# Patient Record
Sex: Male | Born: 1935 | Race: White | Hispanic: No | Smoking: Former smoker
Health system: Southern US, Community
[De-identification: ages and names within clinical notes are randomized; demographics above are authoritative.]

## PROBLEM LIST (undated history)

## (undated) DIAGNOSIS — I2510 Atherosclerotic heart disease of native coronary artery without angina pectoris: Secondary | ICD-10-CM

## (undated) DIAGNOSIS — E785 Hyperlipidemia, unspecified: Secondary | ICD-10-CM

## (undated) DIAGNOSIS — I10 Essential (primary) hypertension: Secondary | ICD-10-CM

## (undated) DIAGNOSIS — I509 Heart failure, unspecified: Secondary | ICD-10-CM

## (undated) DIAGNOSIS — N400 Enlarged prostate without lower urinary tract symptoms: Secondary | ICD-10-CM

## (undated) DIAGNOSIS — N184 Chronic kidney disease, stage 4 (severe): Secondary | ICD-10-CM

## (undated) HISTORY — DX: Heart failure, unspecified: I50.9

## (undated) HISTORY — PX: APPENDECTOMY: SHX54

## (undated) HISTORY — DX: Benign prostatic hyperplasia without lower urinary tract symptoms: N40.0

---

## 1984-09-05 HISTORY — PX: CORONARY ANGIOPLASTY: SHX604

## 2004-09-13 ENCOUNTER — Ambulatory Visit: Payer: Self-pay | Admitting: Internal Medicine

## 2005-09-23 ENCOUNTER — Ambulatory Visit: Payer: Self-pay | Admitting: Internal Medicine

## 2006-10-03 ENCOUNTER — Ambulatory Visit: Payer: Self-pay | Admitting: Internal Medicine

## 2007-07-06 ENCOUNTER — Ambulatory Visit: Payer: Self-pay | Admitting: Physician Assistant

## 2007-07-09 ENCOUNTER — Ambulatory Visit: Payer: Self-pay | Admitting: Ophthalmology

## 2007-07-12 ENCOUNTER — Ambulatory Visit: Payer: Self-pay | Admitting: Physician Assistant

## 2008-12-17 ENCOUNTER — Ambulatory Visit: Payer: Self-pay | Admitting: Internal Medicine

## 2009-02-26 ENCOUNTER — Ambulatory Visit: Payer: Self-pay | Admitting: Ophthalmology

## 2009-03-02 ENCOUNTER — Ambulatory Visit: Payer: Self-pay | Admitting: Ophthalmology

## 2009-12-21 ENCOUNTER — Ambulatory Visit: Payer: Self-pay | Admitting: Internal Medicine

## 2010-10-14 ENCOUNTER — Ambulatory Visit: Payer: Self-pay | Admitting: Internal Medicine

## 2011-07-03 ENCOUNTER — Inpatient Hospital Stay: Payer: Self-pay | Admitting: Internal Medicine

## 2012-03-07 ENCOUNTER — Ambulatory Visit: Payer: Self-pay | Admitting: Internal Medicine

## 2012-05-22 ENCOUNTER — Ambulatory Visit: Payer: Self-pay | Admitting: Internal Medicine

## 2012-10-26 ENCOUNTER — Ambulatory Visit: Payer: Self-pay | Admitting: Internal Medicine

## 2015-05-25 ENCOUNTER — Emergency Department: Payer: Medicare Other

## 2015-05-25 ENCOUNTER — Encounter: Payer: Self-pay | Admitting: Emergency Medicine

## 2015-05-25 ENCOUNTER — Observation Stay
Admission: EM | Admit: 2015-05-25 | Discharge: 2015-05-26 | Disposition: A | Discharge status: Home / Self Care | Payer: Medicare Other | Attending: Internal Medicine | Admitting: Internal Medicine

## 2015-05-25 DIAGNOSIS — R748 Abnormal levels of other serum enzymes: Secondary | ICD-10-CM | POA: Diagnosis not present

## 2015-05-25 DIAGNOSIS — I340 Nonrheumatic mitral (valve) insufficiency: Secondary | ICD-10-CM | POA: Insufficient documentation

## 2015-05-25 DIAGNOSIS — K219 Gastro-esophageal reflux disease without esophagitis: Secondary | ICD-10-CM | POA: Diagnosis not present

## 2015-05-25 DIAGNOSIS — I509 Heart failure, unspecified: Secondary | ICD-10-CM

## 2015-05-25 DIAGNOSIS — N189 Chronic kidney disease, unspecified: Secondary | ICD-10-CM | POA: Diagnosis not present

## 2015-05-25 DIAGNOSIS — I255 Ischemic cardiomyopathy: Secondary | ICD-10-CM | POA: Insufficient documentation

## 2015-05-25 DIAGNOSIS — Z79899 Other long term (current) drug therapy: Secondary | ICD-10-CM | POA: Diagnosis not present

## 2015-05-25 DIAGNOSIS — Z7982 Long term (current) use of aspirin: Secondary | ICD-10-CM | POA: Diagnosis not present

## 2015-05-25 DIAGNOSIS — I5043 Acute on chronic combined systolic (congestive) and diastolic (congestive) heart failure: Secondary | ICD-10-CM | POA: Diagnosis present

## 2015-05-25 DIAGNOSIS — I714 Abdominal aortic aneurysm, without rupture: Secondary | ICD-10-CM | POA: Diagnosis not present

## 2015-05-25 DIAGNOSIS — I5022 Chronic systolic (congestive) heart failure: Secondary | ICD-10-CM | POA: Diagnosis not present

## 2015-05-25 DIAGNOSIS — R0600 Dyspnea, unspecified: Secondary | ICD-10-CM | POA: Diagnosis not present

## 2015-05-25 DIAGNOSIS — R0602 Shortness of breath: Secondary | ICD-10-CM | POA: Insufficient documentation

## 2015-05-25 DIAGNOSIS — I420 Dilated cardiomyopathy: Secondary | ICD-10-CM | POA: Diagnosis not present

## 2015-05-25 DIAGNOSIS — R079 Chest pain, unspecified: Secondary | ICD-10-CM | POA: Insufficient documentation

## 2015-05-25 DIAGNOSIS — N183 Chronic kidney disease, stage 3 (moderate): Secondary | ICD-10-CM | POA: Insufficient documentation

## 2015-05-25 DIAGNOSIS — R61 Generalized hyperhidrosis: Secondary | ICD-10-CM | POA: Insufficient documentation

## 2015-05-25 DIAGNOSIS — N400 Enlarged prostate without lower urinary tract symptoms: Secondary | ICD-10-CM | POA: Insufficient documentation

## 2015-05-25 DIAGNOSIS — I517 Cardiomegaly: Secondary | ICD-10-CM | POA: Insufficient documentation

## 2015-05-25 DIAGNOSIS — I130 Hypertensive heart and chronic kidney disease with heart failure and stage 1 through stage 4 chronic kidney disease, or unspecified chronic kidney disease: Principal | ICD-10-CM | POA: Insufficient documentation

## 2015-05-25 DIAGNOSIS — I700 Atherosclerosis of aorta: Secondary | ICD-10-CM | POA: Insufficient documentation

## 2015-05-25 DIAGNOSIS — I350 Nonrheumatic aortic (valve) stenosis: Secondary | ICD-10-CM | POA: Insufficient documentation

## 2015-05-25 DIAGNOSIS — I2510 Atherosclerotic heart disease of native coronary artery without angina pectoris: Secondary | ICD-10-CM | POA: Insufficient documentation

## 2015-05-25 HISTORY — DX: Atherosclerotic heart disease of native coronary artery without angina pectoris: I25.10

## 2015-05-25 HISTORY — DX: Essential (primary) hypertension: I10

## 2015-05-25 LAB — CBC WITH DIFFERENTIAL/PLATELET
BASOS PCT: 0 %
Basophils Absolute: 0 10*3/uL (ref 0–0.1)
EOS ABS: 0.3 10*3/uL (ref 0–0.7)
Eosinophils Relative: 4 %
HEMATOCRIT: 43.8 % (ref 40.0–52.0)
HEMOGLOBIN: 14.7 g/dL (ref 13.0–18.0)
LYMPHS ABS: 0.9 10*3/uL — AB (ref 1.0–3.6)
Lymphocytes Relative: 12 %
MCH: 28.5 pg (ref 26.0–34.0)
MCHC: 33.4 g/dL (ref 32.0–36.0)
MCV: 85.3 fL (ref 80.0–100.0)
MONO ABS: 0.6 10*3/uL (ref 0.2–1.0)
MONOS PCT: 8 %
NEUTROS PCT: 76 %
Neutro Abs: 5.8 10*3/uL (ref 1.4–6.5)
Platelets: 119 10*3/uL — ABNORMAL LOW (ref 150–440)
RBC: 5.14 MIL/uL (ref 4.40–5.90)
RDW: 15.9 % — AB (ref 11.5–14.5)
WBC: 7.6 10*3/uL (ref 3.8–10.6)

## 2015-05-25 LAB — COMPREHENSIVE METABOLIC PANEL
ALK PHOS: 61 U/L (ref 38–126)
ALT: 17 U/L (ref 17–63)
ANION GAP: 11 (ref 5–15)
AST: 30 U/L (ref 15–41)
Albumin: 4 g/dL (ref 3.5–5.0)
BILIRUBIN TOTAL: 0.8 mg/dL (ref 0.3–1.2)
BUN: 33 mg/dL — ABNORMAL HIGH (ref 6–20)
CALCIUM: 9.3 mg/dL (ref 8.9–10.3)
CO2: 20 mmol/L — ABNORMAL LOW (ref 22–32)
Chloride: 107 mmol/L (ref 101–111)
Creatinine, Ser: 1.68 mg/dL — ABNORMAL HIGH (ref 0.61–1.24)
GFR calc non Af Amer: 33 mL/min — ABNORMAL LOW (ref >60)
GFR, EST AFRICAN AMERICAN: 39 mL/min — AB (ref >60)
Glucose, Bld: 138 mg/dL — ABNORMAL HIGH (ref 65–99)
POTASSIUM: 4.7 mmol/L (ref 3.5–5.1)
Sodium: 138 mmol/L (ref 135–145)
TOTAL PROTEIN: 6.5 g/dL (ref 6.5–8.1)

## 2015-05-25 LAB — TROPONIN I
TROPONIN I: 0.03 ng/mL (ref <0.031)
Troponin I: 0.06 ng/mL — ABNORMAL HIGH (ref <0.031)
Troponin I: 0.12 ng/mL — ABNORMAL HIGH (ref <0.031)

## 2015-05-25 LAB — PROTIME-INR
INR: 1.15
PROTHROMBIN TIME: 14.9 s (ref 11.4–15.0)

## 2015-05-25 LAB — BRAIN NATRIURETIC PEPTIDE: B NATRIURETIC PEPTIDE 5: 1598 pg/mL — AB (ref 0.0–100.0)

## 2015-05-25 LAB — APTT: aPTT: 33 seconds (ref 24–36)

## 2015-05-25 MED ORDER — PANTOPRAZOLE SODIUM 40 MG PO TBEC
40.0000 mg | DELAYED_RELEASE_TABLET | Freq: Every day | ORAL | Status: DC
  Administered 2015-05-25: 40 mg via ORAL
  Filled 2015-05-25: qty 1

## 2015-05-25 MED ORDER — LOSARTAN POTASSIUM 50 MG PO TABS: 100.0000 mg | ORAL_TABLET | Freq: Every day | ORAL | Status: DC

## 2015-05-25 MED ORDER — NITROGLYCERIN 0.4 MG SL SUBL: 0.4000 mg | SUBLINGUAL_TABLET | SUBLINGUAL | Status: DC | PRN

## 2015-05-25 MED ORDER — ASPIRIN EC 325 MG PO TBEC
325.0000 mg | DELAYED_RELEASE_TABLET | Freq: Every day | ORAL | Status: DC
  Administered 2015-05-26: 325 mg via ORAL
  Filled 2015-05-25: qty 1

## 2015-05-25 MED ORDER — ENOXAPARIN SODIUM 40 MG/0.4ML ~~LOC~~ SOLN: 40.0000 mg | SUBCUTANEOUS | Status: DC

## 2015-05-25 MED ORDER — ACETAMINOPHEN 650 MG RE SUPP: 650.0000 mg | Freq: Four times a day (QID) | RECTAL | Status: DC | PRN

## 2015-05-25 MED ORDER — METOPROLOL SUCCINATE ER 50 MG PO TB24: 50.0000 mg | ORAL_TABLET | Freq: Every day | ORAL | Status: DC

## 2015-05-25 MED ORDER — FUROSEMIDE 10 MG/ML IJ SOLN
INTRAMUSCULAR | Status: AC
  Administered 2015-05-25: 20 mg via INTRAVENOUS
  Filled 2015-05-25: qty 2

## 2015-05-25 MED ORDER — TADALAFIL 20 MG PO TABS: 10.0000 mg | ORAL_TABLET | Freq: Every day | ORAL | Status: DC | PRN

## 2015-05-25 MED ORDER — TAB-A-VITE/IRON PO TABS
1.0000 | ORAL_TABLET | Freq: Every day | ORAL | Status: DC
  Filled 2015-05-25 (×2): qty 1

## 2015-05-25 MED ORDER — FUROSEMIDE 10 MG/ML IJ SOLN
20.0000 mg | Freq: Two times a day (BID) | INTRAMUSCULAR | Status: DC
  Administered 2015-05-25: 20 mg via INTRAVENOUS

## 2015-05-25 MED ORDER — FUROSEMIDE 10 MG/ML IJ SOLN
20.0000 mg | Freq: Once | INTRAMUSCULAR | Status: AC
  Administered 2015-05-25: 20 mg via INTRAVENOUS
  Filled 2015-05-25: qty 4

## 2015-05-25 MED ORDER — METOPROLOL SUCCINATE ER 25 MG PO TB24
25.0000 mg | ORAL_TABLET | Freq: Every day | ORAL | Status: DC
  Administered 2015-05-26: 25 mg via ORAL
  Filled 2015-05-25: qty 1

## 2015-05-25 MED ORDER — PRESERVISION AREDS 2 PO CAPS: ORAL_CAPSULE | Freq: Every day | ORAL | Status: DC

## 2015-05-25 MED ORDER — ACETAMINOPHEN 325 MG PO TABS: 650.0000 mg | ORAL_TABLET | Freq: Four times a day (QID) | ORAL | Status: DC | PRN

## 2015-05-25 MED ORDER — SIMVASTATIN 20 MG PO TABS
20.0000 mg | ORAL_TABLET | Freq: Every day | ORAL | Status: DC
  Administered 2015-05-25: 20 mg via ORAL
  Filled 2015-05-25: qty 1

## 2015-05-25 MED ORDER — VITAMIN D 1000 UNITS PO TABS
1000.0000 [IU] | ORAL_TABLET | Freq: Every day | ORAL | Status: DC
  Administered 2015-05-25: 1000 [IU] via ORAL
  Filled 2015-05-25 (×2): qty 1

## 2015-05-25 NOTE — Progress Notes (Signed)
Craig Gallegos is a 81 y.o. male patient admitted from ED awake, alert - oriented  X 4 - no acute distress noted.  VSS - Blood pressure 149/92, pulse 70, temperature 97.3 F (36.3 C), temperature source Oral, resp. rate 18, height  (1.778 m), weight 74.844 kg (165 lb), SpO2 100 %.    IV in place, occlusive dsg intact without redness.  Orientation to room, and floor completed with information packet given to patient/family.  Admission INP armband ID verified with patient, and in place.   fall assessment complete, with patient and family able to verbalize understanding of risk associated with falls, and verbalized understanding to call nsg if feeling dizzy or weak.  Call light within reach, patient able to voice, and demonstrate understanding.  Skin, clean-dry- intact without evidence of bruising, or skin tears.   Rash located on sacral area/verified with Robyn Haber, RN. Tele verification by Greer Ee, RN      Will cont to eval and treat per MD orders.  Rudean Haskell, RN 05/25/2015 1:28 PM

## 2015-05-25 NOTE — ED Notes (Signed)
Reports waking up at 3am with sob and diaphoretic.  Reports "feeling fine right now"

## 2015-05-25 NOTE — Progress Notes (Signed)
Notified Dr. Lady Gary of patient's troponin 0.12. No interventions at this time. MD to round.

## 2015-05-25 NOTE — ED Provider Notes (Signed)
[facility] Emergency Department Provider Note  ____________________________________________  Time seen: Approximately 9:08 AM  I have reviewed the triage vital signs and the nursing notes.   HISTORY  Chief Complaint Shortness of Breath    HPI Craig Gallegos is a 81 y.o. male with history of cardiomyopathy, CHF with EF of 20-25%, aortic stenosis, hypertension, stage III chronic kidney disease, AAA presents for evaluation of sudden onset shortness of breath today. Patient reports that he awoke from sleep at 7 AM with dyspnea as well as sudden sweating. He reports he has also had dyspnea on exertion today. No chest pain. No weight gain. Current severity of symptoms is mild to moderate. Shortness of breath tends to be worse with exertion as well as lying flat. No recent illness including no cough, sneezing, runny nose, congestion, vomiting, diarrhea, fevers or chills. No abdominal pain.   Past Medical History  Diagnosis Date  . Coronary artery disease   . Hypertension     There are no active problems to display for this patient.   History reviewed. No pertinent past surgical history.  Current Outpatient Rx  Name  Route  Sig  Dispense  Refill  . aspirin EC 325 MG tablet   Oral   Take 325 mg by mouth daily.         . cholecalciferol (VITAMIN D) 1000 UNITS tablet   Oral   Take 1,000 Units by mouth at bedtime.         . hydrochlorothiazide (HYDRODIURIL) 12.5 MG tablet   Oral   Take 12.5 mg by mouth daily.         Marland Kitchen losartan (COZAAR) 100 MG tablet   Oral   Take 100 mg by mouth daily.         . metoprolol succinate (TOPROL-XL) 50 MG 24 hr tablet   Oral   Take 50 mg by mouth daily.         . Multiple Vitamins-Minerals (PRESERVISION AREDS 2 PO)   Oral   Take 1 tablet by mouth at bedtime.         . nitroGLYCERIN (NITROSTAT) 0.4 MG SL tablet   Sublingual   Place 0.4 mg under the tongue every 5 (five) minutes as needed for chest pain.           Marland Kitchen omeprazole (PRILOSEC) 20 MG capsule   Oral   Take 20 mg by mouth at bedtime.         . simvastatin (ZOCOR) 20 MG tablet   Oral   Take 20 mg by mouth at bedtime.         . tadalafil (CIALIS) 20 MG tablet   Oral   Take 10-20 mg by mouth daily as needed for erectile dysfunction.            Allergies Review of patient's allergies indicates no known allergies.  History reviewed. No pertinent family history.  Social History Social History  Substance Use Topics  . Smoking status: Never Smoker   . Smokeless tobacco: None  . Alcohol Use: No    Review of Systems Constitutional: No fever/chills Eyes: No visual changes. ENT: No sore throat. Cardiovascular: Denies chest pain. Respiratory: + shortness of breath. Gastrointestinal: No abdominal pain.  No nausea, no vomiting.  No diarrhea.  No constipation. Genitourinary: Negative for dysuria. Musculoskeletal: Negative for back pain. Skin: Negative for rash. Neurological: Negative for headaches, focal weakness or numbness.  10-point ROS otherwise negative.  ____________________________________________   PHYSICAL EXAM: Filed  Vitals:   05/25/15 0917 05/25/15 1030  BP: 158/53 146/62  Pulse:  58  Temp: 98.6 F (37 C)   TempSrc: Oral   Resp: 20 10  Height:  (1.778 m)   Weight: 165 lb (74.844 kg)   SpO2: 98% 98%     Constitutional: Alert and oriented. Well appearing and in no acute distress. Eyes: Conjunctivae are normal. PERRL. EOMI. Head: Atraumatic. Nose: No congestion/rhinnorhea. Mouth/Throat: Mucous membranes are moist.  Oropharynx non-erythematous. Neck: No stridor.  Cardiovascular: Normal rate, regular rhythm. + systolic murmur. Good peripheral circulation. Respiratory: Diminished breath sounds throughout the lung fields, intermittently with tachypnea with the most minimal exertion. Gastrointestinal: Soft and nontender. No distention. No abdominal bruits. No CVA tenderness. Genitourinary:  deferred Musculoskeletal: No lower extremity tenderness nor edema.  No joint effusions. Neurologic:  Normal speech and language. No gross focal neurologic deficits are appreciated. No gait instability. Skin:  Skin is warm, dry and intact. No rash noted. Psychiatric: Mood and affect are normal. Speech and behavior are normal.  ____________________________________________   LABS (all labs ordered are listed, but only abnormal results are displayed)  Labs Reviewed  CBC WITH DIFFERENTIAL/PLATELET - Abnormal; Notable for the following:    RDW 15.9 (*)    Platelets 119 (*)    Lymphs Abs 0.9 (*)    All other components within normal limits  COMPREHENSIVE METABOLIC PANEL - Abnormal; Notable for the following:    CO2 20 (*)    Glucose, Bld 138 (*)    BUN 33 (*)    Creatinine, Ser 1.68 (*)    GFR calc non Af Amer 33 (*)    GFR calc Af Amer 39 (*)    All other components within normal limits  BRAIN NATRIURETIC PEPTIDE - Abnormal; Notable for the following:    B Natriuretic Peptide 1598.0 (*)    All other components within normal limits  TROPONIN I  PROTIME-INR  APTT   ____________________________________________  EKG  ED ECG REPORT I, Gayla Doss, the attending physician, personally viewed and interpreted this ECG.   Date: 05/25/2015  EKG Time: 09:05  Rate: 77  Rhythm:  sinus rhythm with frequent PVCs  Axis: left  Intervals:left bundle branch block  ST&T Change: No acute ST elevation  ____________________________________________  RADIOLOGY  CXR FINDINGS: Mild cardiomegaly. Atherosclerotic calcifications in the transverse aorta mediastinal contours are within normal limits. Diffuse mild interstitial prominence bilaterally suggests mild interstitial pulmonary edema. Kerley B-lines are present in the periphery of the lungs. No pleural effusion, focal airspace consolidation or pneumothorax. No suspicious pulmonary nodule or mass. Mild bronchitic change. No acute osseous  abnormality.  IMPRESSION: 1. Cardiomegaly with mild interstitial pulmonary edema most consistent with mild CHF. 2. Aortic atherosclerosis.   ____________________________________________   PROCEDURES  Procedure(s) performed: None  Critical Care performed: No  ____________________________________________   INITIAL IMPRESSION / ASSESSMENT AND PLAN / ED COURSE  Pertinent labs & imaging results that were available during my care of the patient were reviewed by me and considered in my medical decision making (see chart for details).  Craig Gallegos is a 81 y.o. male with history of cardiomyopathy, CHF with EF of 20-25%, aortic stenosis, hypertension, stage III chronic kidney disease, AAA presents for evaluation of sudden onset shortness of breath today. On exam, he is nontoxic appearing but he does become tachypnea with respiratory rate of 29 with the most minimal exertion/sitting forward. He has dementia breath sounds throughout the lung fields. EKG with multiple PVCs, no acute  ST elevation. First troponin negative. BNP is elevated at nearly 1600, chest x-ray confirms interstitial edema consistent with CHF exacerbation. We'll give IV Lasix. Case discussed with hospitalist, Dr. Luberta Mutter at 10:45 am for admission.  ____________________________________________   FINAL CLINICAL IMPRESSION(S) / ED DIAGNOSES  Final diagnoses:  SOB (shortness of breath)  Acute exacerbation of CHF (congestive heart failure)      Gayla Doss, MD 05/25/15 1053

## 2015-05-25 NOTE — H&P (Signed)
[facility] [place] [facility]   PATIENT NAME: Craig Gallegos    MR#:  [id number]  DATE OF BIRTH:  08-25-1920  DATE OF ADMISSION:  05/25/2015  PRIMARY CARE PHYSICIAN: No primary care provider on file.   REQUESTING/REFERRING PHYSICIAN:DR.Toney Rakes  CHIEF COMPLAINT:   Chief Complaint  Patient presents with  . Shortness of Breath    HISTORY OF PRESENT ILLNESS:  Craig Gallegos  is a 81 y.o. male with a known history of CK distress 3, hypertension, chronic systolic heart failure with EF of 20%, aortic stenosis, history of ischemic cardiomyopathy ,BPH and 7 because of exertional dyspnea, episode of sweating this morning. Patient woke up this morning with sweating and he also noted shortness of breath. Did not have chest pain. No dizziness, no nausea, no vomiting. Patient had episode of exertional dyspnea and Sunday. He denies any weight gain, complement with his diet. Patient sees Dr. Marcina Millard and last time he saw was on August 23. No medication adjustments are done. BNP is 1900. Chest x-ray consistent with congestive heart failure. Patient received IV Lasix 20  milligrams in the emergency room.  PAST MEDICAL HISTORY:   Past Medical History  Diagnosis Date  . Coronary artery disease   . Hypertension     PAST SURGICAL HISTOIRY:  History reviewed. No pertinent past surgical history.  SOCIAL HISTORY:   Social History  Substance Use Topics  . Smoking status: Never Smoker   . Smokeless tobacco: Not on file  . Alcohol Use: No    FAMILY HISTORY:  History reviewed. No pertinent family history.  DRUG ALLERGIES:  No Known Allergies  REVIEW OF SYSTEMS:  CONSTITUTIONAL: No fever, fatigue or weakness.  EYES: No blurred or double vision.  EARS, NOSE, AND THROAT: No tinnitus or ear pain.  RESPIRATORY: No cough, has exertional dyspnea. CARDIOVASCULAR: No chest pain, orthopnea, edema.  GASTROINTESTINAL: No nausea, vomiting, diarrhea or  abdominal pain.  GENITOURINARY: No dysuria, hematuria.  ENDOCRINE: No polyuria, nocturia,  HEMATOLOGY: No anemia, easy bruising or bleeding SKIN: No rash or lesion. MUSCULOSKELETAL: No joint pain or arthritis.   NEUROLOGIC: No tingling, numbness, weakness.  PSYCHIATRY: No anxiety or depression.   MEDICATIONS AT HOME:   Prior to Admission medications   Medication Sig Start Date End Date Taking? Authorizing Provider  aspirin EC 325 MG tablet Take 325 mg by mouth daily.   Yes Historical Provider, MD  cholecalciferol (VITAMIN D) 1000 UNITS tablet Take 1,000 Units by mouth at bedtime.   Yes Historical Provider, MD  hydrochlorothiazide (HYDRODIURIL) 12.5 MG tablet Take 12.5 mg by mouth daily.   Yes Historical Provider, MD  losartan (COZAAR) 100 MG tablet Take 100 mg by mouth daily.   Yes Historical Provider, MD  metoprolol succinate (TOPROL-XL) 50 MG 24 hr tablet Take 50 mg by mouth daily.   Yes Historical Provider, MD  Multiple Vitamins-Minerals (PRESERVISION AREDS 2 PO) Take 1 tablet by mouth at bedtime.   Yes Historical Provider, MD  nitroGLYCERIN (NITROSTAT) 0.4 MG SL tablet Place 0.4 mg under the tongue every 5 (five) minutes as needed for chest pain.    Yes Historical Provider, MD  omeprazole (PRILOSEC) 20 MG capsule Take 20 mg by mouth at bedtime.   Yes Historical Provider, MD  simvastatin (ZOCOR) 20 MG tablet Take 20 mg by mouth at bedtime.   Yes Historical Provider, MD  tadalafil (CIALIS) 20 MG tablet Take 10-20 mg by mouth daily as needed for erectile dysfunction.    Yes  Historical Provider, MD      VITAL SIGNS:  Blood pressure 146/62, pulse 58, temperature 98.6 F (37 C), temperature source Oral, resp. rate 10, height  (1.778 m), weight 74.844 kg (165 lb), SpO2 98 %.  PHYSICAL EXAMINATION:  GENERAL:  81 y.o.-year-old patient lying in the bed with no acute distress.  EYES: Pupils equal, round, reactive to light and accommodation. No scleral icterus. Extraocular muscles  intact.  HEENT: Head atraumatic, normocephalic. Oropharynx and nasopharynx clear.  NECK:  Supple, no jugular venous distention. No thyroid enlargement, no tenderness.  LUNGS: Normal breath sounds bilaterally, no wheezing, rales,rhonchi or crepitation. No use of accessory muscles of respiration.  CARDIOVASCULAR: S1, S2 normal. Ejection systolic murmur heard in aortic area. . ABDOMEN: Soft, nontender, nondistended. Bowel sounds present. No organomegaly or mass.  EXTREMITIES: No pedal edema, cyanosis, or clubbing.  NEUROLOGIC: Cranial nerves II through XII are intact. Muscle strength 5/5 in all extremities. Sensation intact. Gait not checked.  PSYCHIATRIC: The patient is alert and oriented x 3.  SKIN: No obvious rash, lesion, or ulcer.   LABORATORY PANEL:   CBC  Recent Labs Lab 05/25/15 0924  WBC 7.6  HGB 14.7  HCT 43.8  PLT 119*   ------------------------------------------------------------------------------------------------------------------  Chemistries   Recent Labs Lab 05/25/15 0924  NA 138  K 4.7  CL 107  CO2 20*  GLUCOSE 138*  BUN 33*  CREATININE 1.68*  CALCIUM 9.3  AST 30  ALT 17  ALKPHOS 61  BILITOT 0.8   ------------------------------------------------------------------------------------------------------------------  Cardiac Enzymes  Recent Labs Lab 05/25/15 0924  TROPONINI 0.03   ------------------------------------------------------------------------------------------------------------------  RADIOLOGY:  Dg Chest 2 View  05/25/2015   CLINICAL DATA:  81 year old male with shortness of breath, and mild chest pain  EXAM: CHEST  2 VIEW  COMPARISON:  Prior chest x-ray 07/03/2011  FINDINGS: Mild cardiomegaly. Atherosclerotic calcifications in the transverse aorta mediastinal contours are within normal limits. Diffuse mild interstitial prominence bilaterally suggests mild interstitial pulmonary edema. Kerley B-lines are present in the periphery of the lungs.  No pleural effusion, focal airspace consolidation or pneumothorax. No suspicious pulmonary nodule or mass. Mild bronchitic change. No acute osseous abnormality.  IMPRESSION: 1. Cardiomegaly with mild interstitial pulmonary edema most consistent with mild CHF. 2. Aortic atherosclerosis.   Electronically Signed   By: Malachy Moan M.D.   On: 05/25/2015 09:39    EKG:   Orders placed or performed during the hospital encounter of 05/25/15  . EKG 12-Lead  . EKG 12-Lead   EKG shows normal sinus rhythm with frequent PVCs 77 bpm IMPRESSION AND PLAN:   #13.81 year old male with acute on chronic systolic heart failure, EF of 16-10%. Admit him to hospitalist service, started on IV Lasix, hold HCTZ. Continue low-sodium diet. Check daily weights. Monitor on telemetry. Cardiology consult Dr. Darrold Junker.. Patient is on now metoprolol, losartan. Continue them. Echocardiogram report of recent results not available,cardio consult  Appreciated. #2 history of dilated cardiopathy, or aortic stenosis ; history of hypertension controlled  #3 history of chronic kidney disease stage III: Stable monitor kidney function closely. 4.History of BPH. #5. history of GERD continue PPIs #6 CAD continue aspirin, statins. All the records are reviewed and case discussed with ED provider. Management plans discussed with the patient, family and they are in agreement.  CODE STATUS:full  TOTAL TIME TAKING CARE OF THIS PATIENT: .    Katha Hamming M.D on 05/25/2015 at 11:18 AM  Between 7am to 6pm - Pager - [phone]  After 6pm go to [email or web address] -  password [email or web address] [facility]  [facility]  Office  [phone]  CC: Primary care physician; No primary care provider on file.

## 2015-05-25 NOTE — ED Notes (Signed)
Pt reports waking up today at 0700 with shortness of breath and sweating

## 2015-05-26 ENCOUNTER — Observation Stay
Admit: 2015-05-26 | Discharge: 2015-05-26 | Disposition: A | Discharge status: Home / Self Care | Payer: Medicare Other | Attending: Cardiology | Admitting: Cardiology

## 2015-05-26 DIAGNOSIS — I130 Hypertensive heart and chronic kidney disease with heart failure and stage 1 through stage 4 chronic kidney disease, or unspecified chronic kidney disease: Secondary | ICD-10-CM | POA: Diagnosis not present

## 2015-05-26 LAB — BASIC METABOLIC PANEL
Anion gap: 9 (ref 5–15)
BUN: 39 mg/dL — AB (ref 6–20)
CHLORIDE: 106 mmol/L (ref 101–111)
CO2: 26 mmol/L (ref 22–32)
CREATININE: 1.9 mg/dL — AB (ref 0.61–1.24)
Calcium: 9.4 mg/dL (ref 8.9–10.3)
GFR calc Af Amer: 33 mL/min — ABNORMAL LOW (ref >60)
GFR, EST NON AFRICAN AMERICAN: 29 mL/min — AB (ref >60)
Glucose, Bld: 109 mg/dL — ABNORMAL HIGH (ref 65–99)
Potassium: 3.9 mmol/L (ref 3.5–5.1)
SODIUM: 141 mmol/L (ref 135–145)

## 2015-05-26 LAB — TROPONIN I: TROPONIN I: 0.13 ng/mL — AB (ref <0.031)

## 2015-05-26 MED ORDER — FUROSEMIDE 40 MG PO TABS: 40.0000 mg | ORAL_TABLET | Freq: Every day | ORAL | Status: DC

## 2015-05-26 MED ORDER — FUROSEMIDE 40 MG PO TABS
40.0000 mg | ORAL_TABLET | Freq: Every day | ORAL | Status: DC
  Administered 2015-05-26: 40 mg via ORAL
  Filled 2015-05-26: qty 1

## 2015-05-26 MED ORDER — ACETAMINOPHEN 325 MG PO TABS: 650.0000 mg | ORAL_TABLET | Freq: Four times a day (QID) | ORAL | Status: AC | PRN

## 2015-05-26 NOTE — Progress Notes (Signed)
*  PRELIMINARY RESULTS* Echocardiogram 2D Echocardiogram has been performed.  Garrel Ridgel Stills 05/26/2015, 2:16 PM

## 2015-05-26 NOTE — Progress Notes (Addendum)
Patient states that he "hasn't seen a doctor yet".  Dr. Graciela Husbands paged.  Amy Renaldo Fiddler, RN   Addendum: Dr. Graciela Husbands states he is not in the office today.    Addendum: Dr. Leotis Shames stopped by to see patient 05/26/2015 at 1230

## 2015-05-26 NOTE — Progress Notes (Signed)
Patient was made an initial appointment at the outpatient Heart Failure Clinic on June 12, 2015 at 11:00am. Thank you

## 2015-05-26 NOTE — Consult Note (Signed)
[facility] CLINIC CARDIOLOGY [facility]  CARDIOLOGY CONSULT NOTE  Patient ID: KORIE BRABSON MRN: [id number] DOB/AGE: 1920-08-22 81 y.o.  Admit date: 05/25/2015 Referring Physician Graciela Husbands Primary Physician [facility] Primary Cardiologist Paraschos Reason for Consultation CHF  HPI: 81 year old male with history of coronary artery disease status post PTCA of the RCA in 1986. He has a history of dilated cardiomyopathy with an ejection fraction of 25-30% and mild to moderate mitral insufficiency and mild aortic stenosis. He was admitted after developing progressive shortness of breath and diaphoresis. This occurred over a fairly short period of time. He denies any missing of medications or dietary change. He denies any chest pain. He is unaware of whether he had these symptoms prior to his PTCA in the past. He has a very mild troponin elevation at 0.16. He has improved with diuresis. He is currently hemodynamically stable and resting comfortably.  ROS Review of Systems - History obtained from chart review and the patient General ROS: positive for  - Shortness of breath and diaphoresis Respiratory ROS: positive for - shortness of breath Cardiovascular ROS: positive for - shortness of breath and Diaphoresis Gastrointestinal ROS: no abdominal pain, change in bowel habits, or black or bloody stools Neurological ROS: no TIA or stroke symptoms   Past Medical History  Diagnosis Date  . Coronary artery disease   . Hypertension     History reviewed. No pertinent family history.  Social History   Social History  . Marital Status: Widowed    Spouse Name: N/A  . Number of Children: N/A  . Years of Education: N/A   Occupational History  . Not on file.   Social History Main Topics  . Smoking status: Never Smoker   . Smokeless tobacco: Not on file  . Alcohol Use: No  . Drug Use: Not on file  . Sexual Activity: Not on file   Other Topics Concern  . Not on file   Social History  Narrative  . No narrative on file    History reviewed. No pertinent past surgical history.   Prescriptions prior to admission  Medication Sig Dispense Refill Last Dose  . aspirin EC 325 MG tablet Take 325 mg by mouth daily.   05/25/2015 at am  . cholecalciferol (VITAMIN D) 1000 UNITS tablet Take 1,000 Units by mouth at bedtime.   05/24/2015 at pm  . hydrochlorothiazide (HYDRODIURIL) 12.5 MG tablet Take 12.5 mg by mouth daily.   05/25/2015 at am  . losartan (COZAAR) 100 MG tablet Take 100 mg by mouth daily.   05/25/2015 at am  . metoprolol succinate (TOPROL-XL) 50 MG 24 hr tablet Take 50 mg by mouth daily.   05/25/2015 at 0800  . Multiple Vitamins-Minerals (PRESERVISION AREDS 2 PO) Take 1 tablet by mouth at bedtime.   05/24/2015 at pm  . nitroGLYCERIN (NITROSTAT) 0.4 MG SL tablet Place 0.4 mg under the tongue every 5 (five) minutes as needed for chest pain.    PRN  . omeprazole (PRILOSEC) 20 MG capsule Take 20 mg by mouth at bedtime.   05/24/2015 at pm  . simvastatin (ZOCOR) 20 MG tablet Take 20 mg by mouth at bedtime.   05/24/2015 at pm  . tadalafil (CIALIS) 20 MG tablet Take 10-20 mg by mouth daily as needed for erectile dysfunction.    PRN    Physical Exam: Blood pressure 122/54, pulse 61, temperature 98.2 F (36.8 C), temperature source Oral, resp. rate 18, height  (1.778 m), weight 74.844 kg (165 lb),  SpO2 95 %. General appearance: alert and cooperative Resp: rales bibasilar Chest wall: no tenderness Cardio: regular rate and rhythm and systolic murmur: systolic ejection 2/6, crescendo and decrescendo at lower left sternal border GI: soft, non-tender; bowel sounds normal; no masses,  no organomegaly Extremities: extremities normal, atraumatic, no cyanosis or edema Neurologic: Grossly normal Labs:   Lab Results  Component Value Date   WBC 7.6 05/25/2015   HGB 14.7 05/25/2015   HCT 43.8 05/25/2015   MCV 85.3 05/25/2015   PLT 119* 05/25/2015    Recent Labs Lab 05/25/15 0924  05/26/15 0439  NA 138 141  K 4.7 3.9  CL 107 106  CO2 20* 26  BUN 33* 39*  CREATININE 1.68* 1.90*  CALCIUM 9.3 9.4  PROT 6.5  --   BILITOT 0.8  --   ALKPHOS 61  --   ALT 17  --   AST 30  --   GLUCOSE 138* 109*   Lab Results  Component Value Date   TROPONINI 0.13* 05/25/2015      Radiology: Chest x-ray revealed cardiomegaly with mild interstitial pulmonary edema consistent with CHF. EKG: Sinus rhythm with frequent PVCs. No ischemic changes  ASSESSMENT AND PLAN:  81 year old male with history of myopathy with ejection fraction of 25-30% with mild-to-moderate aortic stenosis mild to moderate mitral regurgitation who is admitted with volume overload. He is quite active despite his comorbid condition. He plays golf at least twice a week. He developed shortness of breath which progressed over 2 days prompting him to present to the emergency room where he was noted to be in mild congestive heart failure. He has improved with diuresis. He had a mild troponin elevation but no significant ischemic changes. He did have diaphoresis on presentation. At this point given his improvement with diuresis would continue with medical management and defer invasive evaluation. We'll proceed with an echocardiogram to determine if there isn't any interval change in his heart function or valvular function. We'll discuss with his primary cardiologist. Would continue with enteric-coated aspirin, furosemide which we will convert to oral Lasix, continue with metoprolol succinate at 25 mg daily. Afterload reduction will be carried out with losartan. We'll continue with simvastatin at 20 mg daily. Ambulate patient today after echocardiogram if stable consider discharge with outpatient follow-up with with Dr. Darrold Junker Signed: Dalia Heading MD, [facility] 05/26/2015, 6:57 AM

## 2015-05-26 NOTE — Discharge Instructions (Signed)
Heart Failure Clinic appointment on June 12, 2015 at 11:00am with Clarisa Kindred, FNP. Please call [phone] to reschedule.

## 2015-05-26 NOTE — Progress Notes (Signed)
Removed telemetry and removed PIV.  F/U appt with CHF clinic.  No questions at this time.  Rx e-prescribed to [facility].  Patient to be escorted out of hospital via wheelchair by volunteers.

## 2015-05-26 NOTE — Discharge Summary (Signed)
Physician Discharge Summary  Patient ID: Craig Gallegos MRN: [id number] DOB/AGE: 79-Jun-1921 79 y.o.  Admit date: 05/25/2015 Discharge date: 05/26/2015  Admission Diagnoses: Acute on chronic systolic congestive heart failure, NYHA class III  Discharge Diagnoses:  Active Problems:   Chronic systolic heart failure  Discharged Condition: stable  Hospital Course: 81 year old male with history of dilated cardiomyopathy with an ejection fraction of 25-30%, mild to moderate mitral insufficiency and mild aortic stenosis, coronary artery disease, s/p PTCA of the RCA in 1986. Patient presented with progressive shortness of breath and diaphoresis. No complaint of chest pain. Mildly elevated troponin of 0.16 on labs. Dyspnea improved with gradual diuresis. Patient remained chest pain-free. Echocardiogram was done, results awaited. Patient is anxious to go home. Shall discharge home today. Advised to continue aspirin, statin, beta blocker, diuretic and ARB. Follow-up with cardiology as outpatient.  Consults: cardiology  Discharge Exam: Blood pressure 131/58, pulse 63, temperature 98.1 F (36.7 C), temperature source Oral, resp. rate 15, height  (1.778 m), weight 74.844 kg (165 lb), SpO2 93 %. General: alert, oriented x 3, NAD HENT: mild JVD, no pallor, no icterus Chest: clear to auscultation CVS: RRR, S1S2, no tachycardia Abdomen: soft, nontender, no hepatosplenomegaly Ext: no lower leg edema.  Neuro: moving all extremities  Disposition:   Discharge Instructions    AMB referral to CHF clinic    Complete by:  As directed             Medication List    STOP taking these medications        tadalafil 20 MG tablet  Commonly known as:  CIALIS      TAKE these medications        acetaminophen 325 MG tablet  Commonly known as:  TYLENOL  Take 2 tablets (650 mg total) by mouth every 6 (six) hours as needed for mild pain (or Fever >/= 101).     aspirin EC 325 MG tablet  Take 325 mg  by mouth daily.     cholecalciferol 1000 UNITS tablet  Commonly known as:  VITAMIN D  Take 1,000 Units by mouth at bedtime.     furosemide 40 MG tablet  Commonly known as:  LASIX  Take 1 tablet (40 mg total) by mouth daily.     hydrochlorothiazide 12.5 MG tablet  Commonly known as:  HYDRODIURIL  Take 12.5 mg by mouth daily.     losartan 100 MG tablet  Commonly known as:  COZAAR  Take 100 mg by mouth daily.     metoprolol succinate 50 MG 24 hr tablet  Commonly known as:  TOPROL-XL  Take 50 mg by mouth daily.     nitroGLYCERIN 0.4 MG SL tablet  Commonly known as:  NITROSTAT  Place 0.4 mg under the tongue every 5 (five) minutes as needed for chest pain.     omeprazole 20 MG capsule  Commonly known as:  PRILOSEC  Take 20 mg by mouth at bedtime.     PRESERVISION AREDS 2 PO  Take 1 tablet by mouth at bedtime.     simvastatin 20 MG tablet  Commonly known as:  ZOCOR  Take 20 mg by mouth at bedtime.           Follow-up Information    Follow up with Delma Freeze, FNP. Go on 06/12/2015.   Specialty:  Family Medicine   Why:  at 11:00am , to the [facility]   Contact information:   [address] [place] [address] [phone]       Signed: Singh,Jasmine 05/26/2015, 1:50 PM

## 2015-06-12 ENCOUNTER — Encounter: Payer: Self-pay | Admitting: Family

## 2015-06-12 ENCOUNTER — Ambulatory Visit: Payer: Medicare Other | Attending: Family | Admitting: Family

## 2015-06-12 VITALS — BP 118/35 | HR 66 | Resp 20 | Ht 70.0 in | Wt 173.0 lb

## 2015-06-12 DIAGNOSIS — I10 Essential (primary) hypertension: Secondary | ICD-10-CM | POA: Insufficient documentation

## 2015-06-12 DIAGNOSIS — Z87891 Personal history of nicotine dependence: Secondary | ICD-10-CM | POA: Diagnosis not present

## 2015-06-12 DIAGNOSIS — N400 Enlarged prostate without lower urinary tract symptoms: Secondary | ICD-10-CM | POA: Diagnosis not present

## 2015-06-12 DIAGNOSIS — I5022 Chronic systolic (congestive) heart failure: Secondary | ICD-10-CM | POA: Insufficient documentation

## 2015-06-12 DIAGNOSIS — I2510 Atherosclerotic heart disease of native coronary artery without angina pectoris: Secondary | ICD-10-CM | POA: Diagnosis not present

## 2015-06-12 DIAGNOSIS — Z7982 Long term (current) use of aspirin: Secondary | ICD-10-CM | POA: Diagnosis not present

## 2015-06-12 DIAGNOSIS — Z79899 Other long term (current) drug therapy: Secondary | ICD-10-CM | POA: Diagnosis not present

## 2015-06-12 NOTE — Patient Instructions (Signed)
Continue weighing daily and call for an overnight weight gain of > 2 pounds or a weekly weight gain of >5 pounds. 

## 2015-06-12 NOTE — Progress Notes (Signed)
Subjective:    Patient ID: Craig Gallegos, male    DOB: [birth year], 81 y.o.   MRN: [id number]  Congestive Heart Failure Presents for initial visit. The disease course has been improving. Associated symptoms include fatigue (after playing 18 holes of golf) and palpitations (on occasion). Pertinent negatives include no abdominal pain, chest pain, edema, orthopnea or shortness of breath. The symptoms have been improving. Past treatments include angiotensin receptor blockers, beta blockers, salt and fluid restriction and exercise. The treatment provided significant relief. Compliance with prior treatments has been good. His past medical history is significant for CAD and HTN. There is no history of CVA or DM. Compliance with total regimen is 76-100%.  Hypertension This is a chronic problem. The current episode started more than 1 year ago. The problem has been resolved since onset. The problem is controlled. Associated symptoms include malaise/fatigue and palpitations (on occasion). Pertinent negatives include no anxiety, chest pain, headaches, neck pain, peripheral edema or shortness of breath. There are no associated agents to hypertension. Risk factors for coronary artery disease include male gender. Past treatments include angiotensin blockers, beta blockers, diuretics and lifestyle changes. The current treatment provides significant improvement. There are no compliance problems.  Hypertensive end-organ damage includes heart failure.     Past Medical History  Diagnosis Date  . Coronary artery disease   . Hypertension   . CHF (congestive heart failure) (HCC)   . BPH (benign prostatic hyperplasia)     Past Surgical History  Procedure Laterality Date  . Coronary angioplasty  1986    Family History  Problem Relation Age of Onset  . Heart failure Father   . Heart failure Brother     Social History  Substance Use Topics  . Smoking status: Former Smoker    Types: Pipe  . Smokeless  tobacco: Not on file  . Alcohol Use: 1.8 oz/week    3 Glasses of wine per week    Allergies  Allergen Reactions  . Horse-Derived Products Hives    Horse serum     Prior to Admission medications   Medication Sig Start Date End Date Taking? Authorizing Provider  aspirin EC 325 MG tablet Take 325 mg by mouth daily.   Yes Historical Provider, MD  cholecalciferol (VITAMIN D) 1000 UNITS tablet Take 1,000 Units by mouth at bedtime.   Yes Historical Provider, MD  furosemide (LASIX) 40 MG tablet Take 1 tablet (40 mg total) by mouth daily. 05/26/15  Yes Leotis Shames, MD  hydrochlorothiazide (HYDRODIURIL) 12.5 MG tablet Take 12.5 mg by mouth daily.   Yes Historical Provider, MD  losartan (COZAAR) 100 MG tablet Take 100 mg by mouth daily.   Yes Historical Provider, MD  metoprolol succinate (TOPROL-XL) 50 MG 24 hr tablet Take 50 mg by mouth daily.   Yes Historical Provider, MD  Multiple Vitamins-Minerals (PRESERVISION AREDS 2 PO) Take 1 tablet by mouth at bedtime.   Yes Historical Provider, MD  nitroGLYCERIN (NITROSTAT) 0.4 MG SL tablet Place 0.4 mg under the tongue every 5 (five) minutes as needed for chest pain.    Yes Historical Provider, MD  omeprazole (PRILOSEC) 20 MG capsule Take 20 mg by mouth at bedtime.   Yes Historical Provider, MD  simvastatin (ZOCOR) 20 MG tablet Take 20 mg by mouth at bedtime.   Yes Historical Provider, MD  acetaminophen (TYLENOL) 325 MG tablet Take 2 tablets (650 mg total) by mouth every 6 (six) hours as needed for mild pain (or Fever >/= 101). Patient not  taking: Reported on 06/12/2015 05/26/15   Leotis Shames, MD    Review of Systems  Constitutional: Positive for malaise/fatigue and fatigue (after playing 18 holes of golf). Negative for appetite change.  HENT: Positive for hearing loss (wears a hearing aid). Negative for congestion, postnasal drip and sore throat.   Eyes: Negative.   Respiratory: Negative for cough, chest tightness and shortness of breath.    Cardiovascular: Positive for palpitations (on occasion). Negative for chest pain and leg swelling.  Gastrointestinal: Negative for abdominal pain and abdominal distention.  Endocrine: Negative.   Genitourinary: Negative.   Musculoskeletal: Positive for back pain (lower back). Negative for neck pain.  Skin: Negative.   Allergic/Immunologic: Negative.   Neurological: Negative for dizziness, light-headedness and headaches.  Hematological: Does not bruise/bleed easily.  Psychiatric/Behavioral: Negative for sleep disturbance (sleeping on 1 pillow) and dysphoric mood. The patient is not nervous/anxious.        Objective:   Physical Exam  Constitutional: He is oriented to person, place, and time. He appears well-developed and well-nourished.  HENT:  Head: Normocephalic and atraumatic.  Eyes: Conjunctivae are normal. Pupils are equal, round, and reactive to light.  Neck: Normal range of motion. Neck supple.  Cardiovascular: Normal rate and regular rhythm.   Pulmonary/Chest: Effort normal. No respiratory distress. He has no wheezes. He has no rales.  Abdominal: Soft. He exhibits no distension. There is no tenderness.  Musculoskeletal: He exhibits no edema or tenderness.  Neurological: He is alert and oriented to person, place, and time.  Skin: Skin is warm and dry.  Psychiatric: He has a normal mood and affect. His behavior is normal. Thought content normal.  Nursing note and vitals reviewed.   BP 118/35 mmHg  Pulse 66  Resp 20  Ht  (1.778 m)  Wt 173 lb (78.472 kg)  BMI 24.82 kg/m2  SpO2 99%       Assessment & Plan:  1: Chronic heart failure with reduced ejection fraction- Patient presents with minimal fatigue upon exertion. He says that he notices that he gets a little tired after he plays 18 holes of golf but, otherwise, he doesn't notice fatigue. Denied having any symptoms upon walking into the office today. Is already weighing every morning and his home weight chart was  reviewed and it shows a gradual weight loss. Instructed to call for an overnight weight gain of >2 pounds or a weekly weight gain of >5 pounds. He is adding very little salt to his foods and has been reading food labels carefully. Discussed the importance of following a  sodium diet and written information was given to him regarding his diet. Remains quite active.  2: HTN- Blood pressure actually on the low side but patient without any dizziness. Currently taking furosemide and HCTZ and discussed that his furosemide could probably be stopped. He is supposed to have lab work drawn next week and then an office visit with his PCP the following week so he will discuss this with him.   Return in 1 month or sooner for any questions/problems before the next office visit.

## 2015-07-07 ENCOUNTER — Encounter: Payer: Self-pay | Admitting: Family

## 2015-07-07 ENCOUNTER — Ambulatory Visit: Payer: Medicare Other | Attending: Family | Admitting: Family

## 2015-07-07 VITALS — BP 153/55 | HR 63 | Resp 20 | Ht 70.0 in | Wt 176.0 lb

## 2015-07-07 DIAGNOSIS — Z79899 Other long term (current) drug therapy: Secondary | ICD-10-CM | POA: Diagnosis not present

## 2015-07-07 DIAGNOSIS — I2510 Atherosclerotic heart disease of native coronary artery without angina pectoris: Secondary | ICD-10-CM | POA: Insufficient documentation

## 2015-07-07 DIAGNOSIS — I10 Essential (primary) hypertension: Secondary | ICD-10-CM | POA: Insufficient documentation

## 2015-07-07 DIAGNOSIS — I5022 Chronic systolic (congestive) heart failure: Secondary | ICD-10-CM | POA: Diagnosis present

## 2015-07-07 DIAGNOSIS — Z87891 Personal history of nicotine dependence: Secondary | ICD-10-CM | POA: Diagnosis not present

## 2015-07-07 DIAGNOSIS — Z7982 Long term (current) use of aspirin: Secondary | ICD-10-CM | POA: Insufficient documentation

## 2015-07-07 DIAGNOSIS — N400 Enlarged prostate without lower urinary tract symptoms: Secondary | ICD-10-CM | POA: Diagnosis not present

## 2015-07-07 DIAGNOSIS — N289 Disorder of kidney and ureter, unspecified: Secondary | ICD-10-CM | POA: Insufficient documentation

## 2015-07-07 NOTE — Patient Instructions (Signed)
Continue weighing daily and call for an overnight weight gain of > 2 pounds or a weekly weight gain of >5 pounds. 

## 2015-07-07 NOTE — Progress Notes (Signed)
Subjective:    Patient ID: Craig Gallegos, male    DOB: [birth year], 81 y.o.   MRN: [id number]  Congestive Heart Failure Presents for follow-up visit. The disease course has been improving. Pertinent negatives include no abdominal pain, chest pain, chest pressure, edema, fatigue, orthopnea, palpitations or shortness of breath. The symptoms have been stable. Past treatments include angiotensin receptor blockers, beta blockers and salt and fluid restriction. The treatment provided moderate relief. Compliance with prior treatments has been good. His past medical history is significant for CAD and HTN. Compliance with total regimen is 76-100%.  Hypertension This is a chronic problem. The current episode started more than 1 year ago. The problem is unchanged. The problem is controlled. Pertinent negatives include no chest pain, headaches, malaise/fatigue, neck pain, palpitations, peripheral edema or shortness of breath. There are no associated agents to hypertension. Risk factors for coronary artery disease include male gender. Past treatments include angiotensin blockers, beta blockers, diuretics and lifestyle changes. The current treatment provides moderate improvement. There are no compliance problems.  Hypertensive end-organ damage includes heart failure.   Past Medical History  Diagnosis Date  . Coronary artery disease   . Hypertension   . CHF (congestive heart failure) (HCC)   . BPH (benign prostatic hyperplasia)     Past Surgical History  Procedure Laterality Date  . Coronary angioplasty  1986    Family History  Problem Relation Age of Onset  . Heart failure Father   . Heart failure Brother     Social History  Substance Use Topics  . Smoking status: Former Smoker    Types: Pipe  . Smokeless tobacco: Not on file  . Alcohol Use: 1.8 oz/week    3 Glasses of wine per week    Allergies  Allergen Reactions  . Horse-Derived Products Hives    Horse serum     Prior to Admission  medications   Medication Sig Start Date End Date Taking? Authorizing Provider  acetaminophen (TYLENOL) 325 MG tablet Take 2 tablets (650 mg total) by mouth every 6 (six) hours as needed for mild pain (or Fever >/= 101). 05/26/15  Yes Leotis Shames, MD  aspirin EC 325 MG tablet Take 325 mg by mouth daily.   Yes Historical Provider, MD  cholecalciferol (VITAMIN D) 1000 UNITS tablet Take 1,000 Units by mouth at bedtime.   Yes Historical Provider, MD  furosemide (LASIX) 40 MG tablet Take 1 tablet (40 mg total) by mouth daily. Patient taking differently: Take 40 mg by mouth daily as needed. Pt takes one tablet by mouth daily if weight exceeds 169 lbs. 05/26/15  Yes Leotis Shames, MD  losartan (COZAAR) 100 MG tablet Take 100 mg by mouth daily.   Yes Historical Provider, MD  metoprolol succinate (TOPROL-XL) 50 MG 24 hr tablet Take 50 mg by mouth daily.   Yes Historical Provider, MD  Multiple Vitamins-Minerals (PRESERVISION AREDS 2 PO) Take 1 tablet by mouth at bedtime.   Yes Historical Provider, MD  nitroGLYCERIN (NITROSTAT) 0.4 MG SL tablet Place 0.4 mg under the tongue every 5 (five) minutes as needed for chest pain.    Yes Historical Provider, MD  omeprazole (PRILOSEC) 20 MG capsule Take 20 mg by mouth at bedtime.   Yes Historical Provider, MD  simvastatin (ZOCOR) 20 MG tablet Take 20 mg by mouth at bedtime.   Yes Historical Provider, MD      Review of Systems  Constitutional: Negative for malaise/fatigue, appetite change and fatigue.  HENT: Negative for congestion,  postnasal drip and sore throat.   Eyes: Negative.   Respiratory: Negative for cough, chest tightness and shortness of breath.   Cardiovascular: Negative for chest pain, palpitations and leg swelling.  Gastrointestinal: Negative for abdominal pain and abdominal distention.  Endocrine: Negative.   Genitourinary: Negative.   Musculoskeletal: Positive for back pain (lower back pain oc occasion). Negative for neck pain.  Skin: Negative.    Allergic/Immunologic: Negative.   Neurological: Positive for weakness (neuropathy in hands). Negative for dizziness, light-headedness and headaches.  Hematological: Negative for adenopathy. Does not bruise/bleed easily.  Psychiatric/Behavioral: Negative for sleep disturbance (sleeping on 1 pillow) and dysphoric mood. The patient is not nervous/anxious.        Objective:   Physical Exam  Constitutional: He is oriented to person, place, and time. He appears well-developed and well-nourished.  HENT:  Head: Normocephalic and atraumatic.  Eyes: Conjunctivae are normal. Pupils are equal, round, and reactive to light.  Neck: Normal range of motion. Neck supple.  Cardiovascular: Normal rate and regular rhythm.   Pulmonary/Chest: Effort normal. He has no wheezes. He has no rales.  Abdominal: Soft. He exhibits no distension. There is no tenderness.  Musculoskeletal: He exhibits no edema or tenderness.  Neurological: He is alert and oriented to person, place, and time.  Skin: Skin is warm and dry.  Psychiatric: He has a normal mood and affect. His behavior is normal. Thought content normal.  Nursing note and vitals reviewed.  BP 153/55 mmHg  Pulse 63  Resp 20  Ht 5\' 10"  (1.778 m)  Wt 176 lb (79.833 kg)  BMI 25.25 kg/m2  SpO2 100%        Assessment & Plan:  1: Chronic heart failure with reduced ejection fraction- Patient presents without any fatigue or shortness of breath upon exertion. He denied having any symptoms upon walking into the office today. He says that he has continued to weigh himself daily and reports a stable weight. Reminded to call for an overnight weight gain of >2 pounds or a weekly weight gain of >5 pounds. His diuretic is currently being held due to renal insufficiency and he's to only take his diuretic if his weight is >169 pounds. He is not adding any salt to his food and is trying to follow a low sodium diet. Remains quite active and continues to golf a few times a  week.  2: HTN- Blood pressure is a little bit higher than his previous visit but, again, his furosemide is being held unless his weight goes up >169 pounds. His HCTZ has also been stopped. He follows closely with his PCP regarding his blood pressure.   Return here as needed for any questions/problems before then. Patient feels like he is being followed closely by his PCP and his cardiologist.

## 2015-07-15 ENCOUNTER — Emergency Department: Payer: Medicare Other

## 2015-07-15 ENCOUNTER — Inpatient Hospital Stay
Admission: EM | Admit: 2015-07-15 | Discharge: 2015-07-17 | DRG: 281 | Disposition: A | Discharge status: Home / Self Care | Payer: Medicare Other | Attending: Internal Medicine | Admitting: Internal Medicine

## 2015-07-15 ENCOUNTER — Encounter: Payer: Self-pay | Admitting: Emergency Medicine

## 2015-07-15 DIAGNOSIS — I5022 Chronic systolic (congestive) heart failure: Secondary | ICD-10-CM | POA: Diagnosis present

## 2015-07-15 DIAGNOSIS — E785 Hyperlipidemia, unspecified: Secondary | ICD-10-CM | POA: Diagnosis present

## 2015-07-15 DIAGNOSIS — N179 Acute kidney failure, unspecified: Secondary | ICD-10-CM | POA: Diagnosis present

## 2015-07-15 DIAGNOSIS — N400 Enlarged prostate without lower urinary tract symptoms: Secondary | ICD-10-CM | POA: Diagnosis present

## 2015-07-15 DIAGNOSIS — I25119 Atherosclerotic heart disease of native coronary artery with unspecified angina pectoris: Secondary | ICD-10-CM | POA: Diagnosis present

## 2015-07-15 DIAGNOSIS — Z66 Do not resuscitate: Secondary | ICD-10-CM | POA: Diagnosis present

## 2015-07-15 DIAGNOSIS — Z79899 Other long term (current) drug therapy: Secondary | ICD-10-CM

## 2015-07-15 DIAGNOSIS — Z7982 Long term (current) use of aspirin: Secondary | ICD-10-CM | POA: Diagnosis not present

## 2015-07-15 DIAGNOSIS — I429 Cardiomyopathy, unspecified: Secondary | ICD-10-CM | POA: Diagnosis present

## 2015-07-15 DIAGNOSIS — I2510 Atherosclerotic heart disease of native coronary artery without angina pectoris: Secondary | ICD-10-CM | POA: Diagnosis present

## 2015-07-15 DIAGNOSIS — R7989 Other specified abnormal findings of blood chemistry: Secondary | ICD-10-CM

## 2015-07-15 DIAGNOSIS — R7881 Bacteremia: Secondary | ICD-10-CM | POA: Diagnosis present

## 2015-07-15 DIAGNOSIS — I214 Non-ST elevation (NSTEMI) myocardial infarction: Secondary | ICD-10-CM | POA: Diagnosis not present

## 2015-07-15 DIAGNOSIS — R079 Chest pain, unspecified: Secondary | ICD-10-CM | POA: Diagnosis present

## 2015-07-15 DIAGNOSIS — B9620 Unspecified Escherichia coli [E. coli] as the cause of diseases classified elsewhere: Secondary | ICD-10-CM | POA: Diagnosis present

## 2015-07-15 DIAGNOSIS — K219 Gastro-esophageal reflux disease without esophagitis: Secondary | ICD-10-CM | POA: Diagnosis present

## 2015-07-15 DIAGNOSIS — I4891 Unspecified atrial fibrillation: Secondary | ICD-10-CM | POA: Diagnosis present

## 2015-07-15 DIAGNOSIS — N183 Chronic kidney disease, stage 3 (moderate): Secondary | ICD-10-CM | POA: Diagnosis present

## 2015-07-15 DIAGNOSIS — Z87891 Personal history of nicotine dependence: Secondary | ICD-10-CM | POA: Diagnosis not present

## 2015-07-15 DIAGNOSIS — K8020 Calculus of gallbladder without cholecystitis without obstruction: Secondary | ICD-10-CM | POA: Diagnosis present

## 2015-07-15 DIAGNOSIS — I130 Hypertensive heart and chronic kidney disease with heart failure and stage 1 through stage 4 chronic kidney disease, or unspecified chronic kidney disease: Secondary | ICD-10-CM | POA: Diagnosis present

## 2015-07-15 DIAGNOSIS — I714 Abdominal aortic aneurysm, without rupture: Secondary | ICD-10-CM | POA: Diagnosis present

## 2015-07-15 DIAGNOSIS — R52 Pain, unspecified: Secondary | ICD-10-CM

## 2015-07-15 DIAGNOSIS — R778 Other specified abnormalities of plasma proteins: Secondary | ICD-10-CM

## 2015-07-15 DIAGNOSIS — Z91048 Other nonmedicinal substance allergy status: Secondary | ICD-10-CM

## 2015-07-15 DIAGNOSIS — K859 Acute pancreatitis, unspecified: Secondary | ICD-10-CM

## 2015-07-15 LAB — URINALYSIS COMPLETE WITH MICROSCOPIC (ARMC ONLY)
BACTERIA UA: NONE SEEN
Bilirubin Urine: NEGATIVE
GLUCOSE, UA: NEGATIVE mg/dL
HGB URINE DIPSTICK: NEGATIVE
Ketones, ur: NEGATIVE mg/dL
LEUKOCYTES UA: NEGATIVE
Nitrite: NEGATIVE
PH: 5 (ref 5.0–8.0)
Protein, ur: NEGATIVE mg/dL
SQUAMOUS EPITHELIAL / LPF: NONE SEEN
Specific Gravity, Urine: 1.036 — ABNORMAL HIGH (ref 1.005–1.030)
WBC, UA: NONE SEEN WBC/hpf (ref 0–5)

## 2015-07-15 LAB — CBC WITH DIFFERENTIAL/PLATELET
Basophils Absolute: 0 10*3/uL (ref 0–0.1)
Basophils Relative: 1 %
EOS ABS: 0.5 10*3/uL (ref 0–0.7)
EOS PCT: 8 %
HCT: 39 % — ABNORMAL LOW (ref 40.0–52.0)
Hemoglobin: 13.2 g/dL (ref 13.0–18.0)
LYMPHS ABS: 1.1 10*3/uL (ref 1.0–3.6)
LYMPHS PCT: 19 %
MCH: 28.7 pg (ref 26.0–34.0)
MCHC: 34 g/dL (ref 32.0–36.0)
MCV: 84.4 fL (ref 80.0–100.0)
MONO ABS: 0.6 10*3/uL (ref 0.2–1.0)
Monocytes Relative: 11 %
Neutro Abs: 3.5 10*3/uL (ref 1.4–6.5)
Neutrophils Relative %: 61 %
PLATELETS: 126 10*3/uL — AB (ref 150–440)
RBC: 4.62 MIL/uL (ref 4.40–5.90)
RDW: 16.1 % — AB (ref 11.5–14.5)
WBC: 5.8 10*3/uL (ref 3.8–10.6)

## 2015-07-15 LAB — PROTIME-INR
INR: 1.14
Prothrombin Time: 14.8 seconds (ref 11.4–15.0)

## 2015-07-15 LAB — COMPREHENSIVE METABOLIC PANEL
ALT: 34 U/L (ref 17–63)
AST: 84 U/L — ABNORMAL HIGH (ref 15–41)
Albumin: 3.7 g/dL (ref 3.5–5.0)
Alkaline Phosphatase: 111 U/L (ref 38–126)
Anion gap: 3 — ABNORMAL LOW (ref 5–15)
BUN: 35 mg/dL — AB (ref 6–20)
CHLORIDE: 110 mmol/L (ref 101–111)
CO2: 27 mmol/L (ref 22–32)
CREATININE: 1.53 mg/dL — AB (ref 0.61–1.24)
Calcium: 8.6 mg/dL — ABNORMAL LOW (ref 8.9–10.3)
GFR, EST AFRICAN AMERICAN: 43 mL/min — AB (ref >60)
GFR, EST NON AFRICAN AMERICAN: 37 mL/min — AB (ref >60)
Glucose, Bld: 126 mg/dL — ABNORMAL HIGH (ref 65–99)
POTASSIUM: 3.8 mmol/L (ref 3.5–5.1)
SODIUM: 140 mmol/L (ref 135–145)
Total Bilirubin: 0.9 mg/dL (ref 0.3–1.2)
Total Protein: 6.1 g/dL — ABNORMAL LOW (ref 6.5–8.1)

## 2015-07-15 LAB — HEMOGLOBIN A1C: Hgb A1c MFr Bld: 5.5 % (ref 4.0–6.0)

## 2015-07-15 LAB — TROPONIN I
TROPONIN I: 0.05 ng/mL — AB (ref <0.031)
Troponin I: 5.02 ng/mL — ABNORMAL HIGH (ref <0.031)
Troponin I: 6.23 ng/mL — ABNORMAL HIGH (ref <0.031)

## 2015-07-15 LAB — MRSA PCR SCREENING: MRSA BY PCR: NEGATIVE

## 2015-07-15 LAB — TSH: TSH: 0.793 u[IU]/mL (ref 0.350–4.500)

## 2015-07-15 LAB — LIPASE, BLOOD: LIPASE: 49 U/L (ref 11–51)

## 2015-07-15 MED ORDER — VITAMIN D 1000 UNITS PO TABS
1000.0000 [IU] | ORAL_TABLET | Freq: Every day | ORAL | Status: DC
  Administered 2015-07-15 – 2015-07-16 (×2): 1000 [IU] via ORAL
  Filled 2015-07-15 (×2): qty 1

## 2015-07-15 MED ORDER — ACETAMINOPHEN 325 MG PO TABS: 650.0000 mg | ORAL_TABLET | Freq: Four times a day (QID) | ORAL | Status: DC | PRN

## 2015-07-15 MED ORDER — FUROSEMIDE 40 MG PO TABS
40.0000 mg | ORAL_TABLET | ORAL | Status: DC
  Administered 2015-07-15: 40 mg via ORAL
  Filled 2015-07-15: qty 1

## 2015-07-15 MED ORDER — METOPROLOL SUCCINATE ER 50 MG PO TB24
50.0000 mg | ORAL_TABLET | Freq: Every day | ORAL | Status: DC
  Administered 2015-07-15: 50 mg via ORAL
  Filled 2015-07-15: qty 1

## 2015-07-15 MED ORDER — PIPERACILLIN-TAZOBACTAM 3.375 G IVPB
3.3750 g | Freq: Three times a day (TID) | INTRAVENOUS | Status: DC
  Administered 2015-07-15 – 2015-07-17 (×5): 3.375 g via INTRAVENOUS
  Filled 2015-07-15 (×7): qty 50

## 2015-07-15 MED ORDER — ONDANSETRON HCL 4 MG/2ML IJ SOLN: 4.0000 mg | Freq: Four times a day (QID) | INTRAMUSCULAR | Status: DC | PRN

## 2015-07-15 MED ORDER — PIPERACILLIN-TAZOBACTAM 3.375 G IVPB
3.3750 g | Freq: Once | INTRAVENOUS | Status: AC
  Administered 2015-07-15: 3.375 g via INTRAVENOUS
  Filled 2015-07-15: qty 50

## 2015-07-15 MED ORDER — ASPIRIN EC 325 MG PO TBEC
325.0000 mg | DELAYED_RELEASE_TABLET | Freq: Every day | ORAL | Status: DC
  Administered 2015-07-15 – 2015-07-17 (×3): 325 mg via ORAL
  Filled 2015-07-15 (×3): qty 1

## 2015-07-15 MED ORDER — IOHEXOL 350 MG/ML SOLN
80.0000 mL | Freq: Once | INTRAVENOUS | Status: AC | PRN
  Administered 2015-07-15: 80 mL via INTRAVENOUS

## 2015-07-15 MED ORDER — SIMVASTATIN 20 MG PO TABS: 20.0000 mg | ORAL_TABLET | Freq: Every day | ORAL | Status: DC

## 2015-07-15 MED ORDER — LOSARTAN POTASSIUM 50 MG PO TABS
100.0000 mg | ORAL_TABLET | Freq: Every day | ORAL | Status: DC
  Administered 2015-07-15: 100 mg via ORAL
  Filled 2015-07-15: qty 2

## 2015-07-15 MED ORDER — PIPERACILLIN-TAZOBACTAM 3.375 G IVPB
3.3750 g | Freq: Three times a day (TID) | INTRAVENOUS | Status: DC
  Administered 2015-07-15: 3.375 g via INTRAVENOUS
  Filled 2015-07-15 (×2): qty 50

## 2015-07-15 MED ORDER — ATORVASTATIN CALCIUM 20 MG PO TABS
40.0000 mg | ORAL_TABLET | Freq: Every day | ORAL | Status: DC
  Administered 2015-07-15 – 2015-07-16 (×2): 40 mg via ORAL
  Filled 2015-07-15 (×2): qty 2

## 2015-07-15 MED ORDER — MORPHINE SULFATE (PF) 2 MG/ML IV SOLN: 2.0000 mg | INTRAVENOUS | Status: DC | PRN

## 2015-07-15 MED ORDER — DOCUSATE SODIUM 100 MG PO CAPS
100.0000 mg | ORAL_CAPSULE | Freq: Two times a day (BID) | ORAL | Status: DC
  Administered 2015-07-15 – 2015-07-17 (×5): 100 mg via ORAL
  Filled 2015-07-15 (×5): qty 1

## 2015-07-15 MED ORDER — SODIUM CHLORIDE 0.9 % IJ SOLN
3.0000 mL | Freq: Two times a day (BID) | INTRAMUSCULAR | Status: DC
  Administered 2015-07-17: 3 mL via INTRAVENOUS

## 2015-07-15 MED ORDER — MORPHINE SULFATE (PF) 2 MG/ML IV SOLN
2.0000 mg | Freq: Once | INTRAVENOUS | Status: AC
  Administered 2015-07-15: 2 mg via INTRAVENOUS

## 2015-07-15 MED ORDER — SODIUM CHLORIDE 0.9 % IV SOLN
INTRAVENOUS | Status: AC
  Administered 2015-07-15: 06:00:00 via INTRAVENOUS

## 2015-07-15 MED ORDER — ONDANSETRON HCL 4 MG/2ML IJ SOLN
INTRAMUSCULAR | Status: AC
  Administered 2015-07-15: 4 mg via INTRAVENOUS
  Filled 2015-07-15: qty 2

## 2015-07-15 MED ORDER — NITROGLYCERIN 0.4 MG SL SUBL: 0.4000 mg | SUBLINGUAL_TABLET | SUBLINGUAL | Status: DC | PRN

## 2015-07-15 MED ORDER — ONDANSETRON HCL 4 MG/2ML IJ SOLN
4.0000 mg | Freq: Once | INTRAMUSCULAR | Status: AC
Start: 2015-07-15 — End: 2015-07-15
  Administered 2015-07-15: 4 mg via INTRAVENOUS

## 2015-07-15 MED ORDER — ACETAMINOPHEN 650 MG RE SUPP
975.0000 mg | Freq: Once | RECTAL | Status: AC
  Administered 2015-07-15: 975 mg via RECTAL
  Filled 2015-07-15: qty 1

## 2015-07-15 MED ORDER — NITROGLYCERIN 2 % TD OINT
0.5000 [in_us] | TOPICAL_OINTMENT | Freq: Four times a day (QID) | TRANSDERMAL | Status: DC
  Administered 2015-07-15 – 2015-07-16 (×4): 0.5 [in_us] via TOPICAL
  Filled 2015-07-15 (×5): qty 1

## 2015-07-15 MED ORDER — CLOPIDOGREL BISULFATE 75 MG PO TABS
75.0000 mg | ORAL_TABLET | Freq: Every day | ORAL | Status: DC
  Administered 2015-07-15 – 2015-07-17 (×3): 75 mg via ORAL
  Filled 2015-07-15 (×3): qty 1

## 2015-07-15 MED ORDER — HEPARIN SODIUM (PORCINE) 5000 UNIT/ML IJ SOLN
5000.0000 [IU] | Freq: Three times a day (TID) | INTRAMUSCULAR | Status: DC
  Administered 2015-07-15 – 2015-07-16 (×6): 5000 [IU] via SUBCUTANEOUS
  Filled 2015-07-15 (×8): qty 1

## 2015-07-15 MED ORDER — ACETAMINOPHEN 650 MG RE SUPP: 650.0000 mg | Freq: Four times a day (QID) | RECTAL | Status: DC | PRN

## 2015-07-15 MED ORDER — PANTOPRAZOLE SODIUM 40 MG PO TBEC
40.0000 mg | DELAYED_RELEASE_TABLET | Freq: Every day | ORAL | Status: DC
  Administered 2015-07-15 – 2015-07-17 (×3): 40 mg via ORAL
  Filled 2015-07-15 (×3): qty 1

## 2015-07-15 MED ORDER — ONDANSETRON HCL 4 MG/2ML IJ SOLN
4.0000 mg | Freq: Once | INTRAMUSCULAR | Status: AC
  Administered 2015-07-15: 4 mg via INTRAVENOUS

## 2015-07-15 MED ORDER — MORPHINE SULFATE (PF) 2 MG/ML IV SOLN
INTRAVENOUS | Status: AC
  Administered 2015-07-15: 2 mg via INTRAVENOUS
  Filled 2015-07-15: qty 1

## 2015-07-15 MED ORDER — ONDANSETRON HCL 4 MG/2ML IJ SOLN
INTRAMUSCULAR | Status: AC
Start: 2015-07-15 — End: 2015-07-15
  Administered 2015-07-15: 4 mg via INTRAVENOUS
  Filled 2015-07-15: qty 2

## 2015-07-15 MED ORDER — ONDANSETRON HCL 4 MG PO TABS: 4.0000 mg | ORAL_TABLET | Freq: Four times a day (QID) | ORAL | Status: DC | PRN

## 2015-07-15 NOTE — H&P (Signed)
Craig Gallegos is an 81 y.o. male.    Chief Complaint: Abdominal pain HPI: The patient presents emergency department after awakening this evening with abdominal pain. He has known history of a 4 cm abdominal aortic aneurysm. Pain subsided after pain medication in the emergency department. He denies chest pain and shortness of breath but laboratory evaluation indicates elevated troponin. CT of the abdomen showed aneurysm stable in size. It also revealed gallstones.  In the emergency department the patient began having chills and rigors.  He also developed new onset atrial fibrillation.  Blood cultures were obtained and the patient was started on Zosyn. Right upper quadrant ultrasound was ordered to evaluate for obstruction and the emergency department staff called for admission.   Past Medical History  Diagnosis Date  . Coronary artery disease   . Hypertension   . CHF (congestive heart failure) ([place])   . BPH (benign prostatic hyperplasia)     Past Surgical History  Procedure Laterality Date  . Coronary angioplasty  1986    Family History  Problem Relation Age of Onset  . Heart failure Father   . Heart failure Brother    Social History:  reports that he has quit smoking. His smoking use included Pipe. He does not have any smokeless tobacco history on file. He reports that he drinks about 1.8 oz of alcohol per week. He reports that he does not use illicit drugs.  Allergies:  Allergies  Allergen Reactions  . Horse-Derived Products Hives    Horse serum     Medications Prior to Admission  Medication Sig Dispense Refill  . acetaminophen (TYLENOL) 325 MG tablet Take 2 tablets (650 mg total) by mouth every 6 (six) hours as needed for mild pain (or Fever >/= 101). 30 tablet 0  . aspirin EC 325 MG tablet Take 325 mg by mouth daily.    . cholecalciferol (VITAMIN D) 1000 UNITS tablet Take 1,000 Units by mouth at bedtime.    . furosemide (LASIX) 40 MG tablet Take 1 tablet (40 mg total) by  mouth daily. (Patient taking differently: Take 40 mg by mouth daily as needed. Pt takes one tablet by mouth daily if weight exceeds 169 lbs.) 30 tablet 0  . losartan (COZAAR) 100 MG tablet Take 100 mg by mouth daily.    . metoprolol succinate (TOPROL-XL) 50 MG 24 hr tablet Take 50 mg by mouth daily.    . Multiple Vitamins-Minerals (PRESERVISION AREDS 2 PO) Take 1 tablet by mouth at bedtime.    . nitroGLYCERIN (NITROSTAT) 0.4 MG SL tablet Place 0.4 mg under the tongue every 5 (five) minutes as needed for chest pain.     Marland Kitchen omeprazole (PRILOSEC) 20 MG capsule Take 20 mg by mouth at bedtime.    . simvastatin (ZOCOR) 20 MG tablet Take 20 mg by mouth at bedtime.      Results for orders placed or performed during the hospital encounter of 07/15/15 (from the past 48 hour(s))  CBC with Differential     Status: Abnormal   Collection Time: 07/15/15  1:43 AM  Result Value Ref Range   WBC 5.8 3.8 - 10.6 K/uL   RBC 4.62 4.40 - 5.90 MIL/uL   Hemoglobin 13.2 13.0 - 18.0 g/dL   HCT 39.0 (L) 40.0 - 52.0 %   MCV 84.4 80.0 - 100.0 fL   MCH 28.7 26.0 - 34.0 pg   MCHC 34.0 32.0 - 36.0 g/dL   RDW 16.1 (H) 11.5 - 14.5 %   Platelets  126 (L) 150 - 440 K/uL   Neutrophils Relative % 61 %   Neutro Abs 3.5 1.4 - 6.5 K/uL   Lymphocytes Relative 19 %   Lymphs Abs 1.1 1.0 - 3.6 K/uL   Monocytes Relative 11 %   Monocytes Absolute 0.6 0.2 - 1.0 K/uL   Eosinophils Relative 8 %   Eosinophils Absolute 0.5 0 - 0.7 K/uL   Basophils Relative 1 %   Basophils Absolute 0.0 0 - 0.1 K/uL  Comprehensive metabolic panel     Status: Abnormal   Collection Time: 07/15/15  1:43 AM  Result Value Ref Range   Sodium 140 135 - 145 mmol/L   Potassium 3.8 3.5 - 5.1 mmol/L   Chloride 110 101 - 111 mmol/L   CO2 27 22 - 32 mmol/L   Glucose, Bld 126 (H) 65 - 99 mg/dL   BUN 35 (H) 6 - 20 mg/dL   Creatinine, Ser 1.53 (H) 0.61 - 1.24 mg/dL   Calcium 8.6 (L) 8.9 - 10.3 mg/dL   Total Protein 6.1 (L) 6.5 - 8.1 g/dL   Albumin 3.7 3.5 - 5.0  g/dL   AST 84 (H) 15 - 41 U/L   ALT 34 17 - 63 U/L   Alkaline Phosphatase 111 38 - 126 U/L   Total Bilirubin 0.9 0.3 - 1.2 mg/dL   GFR calc non Af Amer 37 (L) >60 mL/min   GFR calc Af Amer 43 (L) >60 mL/min    Comment: (NOTE) The eGFR has been calculated using the CKD EPI equation. This calculation has not been validated in all clinical situations. eGFR's persistently <60 mL/min signify possible Chronic Kidney Disease.    Anion gap 3 (L) 5 - 15  Lipase, blood     Status: None   Collection Time: 07/15/15  1:43 AM  Result Value Ref Range   Lipase 49 11 - 51 U/L  Troponin I     Status: Abnormal   Collection Time: 07/15/15  1:43 AM  Result Value Ref Range   Troponin I 0.05 (H) <0.031 ng/mL    Comment: CRITICAL RESULT CALLED TO, READ BACK BY AND VERIFIED WITH  [facility] YUAL AT 3419 07/15/15 WDM        PERSISTENTLY INCREASED TROPONIN VALUES IN THE RANGE OF 0.04-0.49 ng/mL CAN BE SEEN IN:       -UNSTABLE ANGINA       -CONGESTIVE HEART FAILURE       -MYOCARDITIS       -CHEST TRAUMA       -ARRYHTHMIAS       -LATE PRESENTING MYOCARDIAL INFARCTION       -COPD   CLINICAL FOLLOW-UP RECOMMENDED.   Protime-INR     Status: None   Collection Time: 07/15/15  1:43 AM  Result Value Ref Range   Prothrombin Time 14.8 11.4 - 15.0 seconds   INR 1.14   Urinalysis complete, with microscopic ([facility] only)     Status: Abnormal   Collection Time: 07/15/15  3:56 AM  Result Value Ref Range   Color, Urine YELLOW (A) YELLOW   APPearance CLEAR (A) CLEAR   Glucose, UA NEGATIVE NEGATIVE mg/dL   Bilirubin Urine NEGATIVE NEGATIVE   Ketones, ur NEGATIVE NEGATIVE mg/dL   Specific Gravity, Urine 1.036 (H) 1.005 - 1.030   Hgb urine dipstick NEGATIVE NEGATIVE   pH 5.0 5.0 - 8.0   Protein, ur NEGATIVE NEGATIVE mg/dL   Nitrite NEGATIVE NEGATIVE   Leukocytes, UA NEGATIVE NEGATIVE   RBC / HPF 0-5  0 - 5 RBC/hpf   WBC, UA NONE SEEN 0 - 5 WBC/hpf   Bacteria, UA NONE SEEN NONE SEEN   Squamous Epithelial / LPF NONE  SEEN NONE SEEN   Mucous PRESENT    Ct Angio Abdomen W/cm &/or Wo Contrast  07/15/2015  CLINICAL DATA:  Epigastric pain awakening patient from sleep. History of abdominal aortic aneurysm. EXAM: CT ANGIOGRAPHY ABDOMEN TECHNIQUE: Multidetector CT imaging of the abdomen was performed using the standard protocol during bolus administration of intravenous contrast. Multiplanar reconstructed images including MIPs were obtained and reviewed to evaluate the vascular anatomy. CONTRAST:  107m OMNIPAQUE IOHEXOL 350 MG/ML SOLN COMPARISON:  None. FINDINGS: Lower chest and abdominal wall:  Left inguinal hernia repair Hepatobiliary: No focal liver abnormality.Faceted high density gallstones within the noninflamed gallbladder. Pancreas: Mild edema around the pancreatic head and body. No fluid collection or ductal enlargement. Lipase is currently pending. Spleen: Unremarkable. Adrenals/Urinary Tract: Negative adrenals. Mild bilateral renal atrophy with cystic change in the right renal cortex. No hydronephrosis. Mild bladder wall thickening circumferentially, likely from chronic outlet obstruction. Reproductive:Symmetric prostate enlargement. Stomach/Bowel: 10 mm Ovoid high-density structure in the peripheral gastric body was initially concerning for a pseudoaneurysm given isodensity to the arteries (with surrounding heterogeneously high density luminal contents potentially indicating hematoma) but no vessel continuity and after discussion with Dr. SBeather Arbourthere is no indication of GI hemorrhage. This is presumably ingested material with pill. Extensive distal colonic diverticulosis without active inflammation. No bowel obstruction. No pericecal inflammatory change. Vascular/Lymphatic: Fusiform abdominal aortic aneurysm measuring 4 cm in maximal dimension. The aneurysm contacts the third portion duodenum without high density small bowel contents or extraluminal gas. Atherosclerosis throughout the abdominal aorta and branch vessels No  mass or adenopathy. Peritoneal: No ascites or pneumoperitoneum. Musculoskeletal: Diffuse and advanced lumbar disc and facet degeneration with grade 1 anterolisthesis at L5-S1. Degenerative changes have progressed since 2008 lumbar spine MRI. These results were called by telephone at the time of interpretation on 07/15/2015 at 2:26 am to Dr. JLurline Hare, who verbally acknowledged these results. Review of the MIP images confirms the above findings. IMPRESSION: 1. Mild pancreatitis.  Please confirm with serum enzymes. 2. 4 cm infrarenal aortic aneurysm. 3. Cholelithiasis. 4. Colonic diverticulosis. Electronically Signed   By: JMonte FantasiaM.D.   On: 07/15/2015 02:33   Dg Chest Port 1 View  07/15/2015  CLINICAL DATA:  Epigastric pain. EXAM: PORTABLE CHEST 1 VIEW COMPARISON:  05/25/2015 FINDINGS: Chronic cardiomegaly with aortic tortuosity. Prominent right mediastinal contours from tortuous and potentially ectatic ascending aorta, appearance stable from prior. Similar appearance of prominent central vessels. There is no edema, consolidation, effusion, or pneumothorax. IMPRESSION: 1. No acute finding. 2. Cardiomegaly and tortuous aorta. Electronically Signed   By: JMonte FantasiaM.D.   On: 07/15/2015 02:01   [place]dom[place] Limited Ruq  07/15/2015  CLINICAL DATA:  Gallstones on CT.  Rigors. EXAM: [place]DOM[place] LIMITED - RIGHT UPPER QUADRANT COMPARISON:  CT from earlier today FINDINGS: Gallbladder: Multiple shadowing gallstones which do not move from the gallbladder neck. No wall thickening or focal tenderness. No pericholecystic edema. Common bile duct: Diameter: 5 mm were visualized. Liver: No focal lesion identified. Within normal limits in parenchymal echogenicity. Antegrade flow in the imaged portal venous system. IMPRESSION: Nonmobile cholelithiasis.  No evidence of acute cholecystitis. Electronically Signed   By: JMonte FantasiaM.D.   On: 07/15/2015 05:33    Review of Systems  Constitutional: Positive for  chills. Negative for fever.  HENT: Negative for  sore throat and tinnitus.   Eyes: Negative for blurred vision and redness.  Respiratory: Negative for cough and shortness of breath.   Cardiovascular: Negative for chest pain, palpitations, orthopnea and PND.  Gastrointestinal: Positive for abdominal pain. Negative for nausea, vomiting and diarrhea.  Genitourinary: Negative for dysuria, urgency and frequency.  Musculoskeletal: Negative for myalgias and joint pain.  Skin: Negative for rash.       No lesions  Neurological: Negative for speech change, focal weakness and weakness.  Endo/Heme/Allergies: Does not bruise/bleed easily.       No temperature intolerance  Psychiatric/Behavioral: Negative for depression and suicidal ideas.    Blood pressure 147/52, pulse 85, temperature 98.7 F (37.1 C), temperature source Oral, resp. rate 20, height 5' 10"  (1.778 m), weight 79.289 kg (174 lb 12.8 oz), SpO2 96 %. Physical Exam  Nursing note and vitals reviewed. Constitutional: He is oriented to person, place, and time. He appears well-developed and well-nourished. No distress.  HENT:  Head: Normocephalic and atraumatic.  Mouth/Throat: Oropharynx is clear and moist.  Eyes: Conjunctivae are normal. Pupils are equal, round, and reactive to light. No scleral icterus.  Neck: Normal range of motion. Neck supple. No JVD present. No tracheal deviation present. No thyromegaly present.  Cardiovascular: Normal rate and normal heart sounds.  An irregularly irregular rhythm present. Exam reveals no gallop and no friction rub.   No murmur heard. Respiratory: Effort normal and breath sounds normal.  GI: Soft. Bowel sounds are normal. He exhibits no distension. There is no tenderness.  Musculoskeletal: Normal range of motion. He exhibits no edema.  Lymphadenopathy:    He has no cervical adenopathy.  Neurological: He is alert and oriented to person, place, and time. No cranial nerve deficit.  Skin: Skin is warm  and dry. No rash noted. No erythema.  Psychiatric: He has a normal mood and affect. His behavior is normal. Judgment and thought content normal.     Assessment/Plan This is a 81 year old Caucasian male admitted for sepsis syndrome, abdominal pain and evaluation of chest pain. 1. Sepsis: Patient meets criteria via tachycardia and respiratory rate. Follow blood cultures for growth and sensitivities. Continue broad-spectrum antibiotics. The patient is hemodynamically stable. 2. AAA: Stable in size. The patient last had imaging of the aneurysm 6 months ago. Acute onset of pain still concerning however. Have surgery service evaluate for possible intervention (IVUS, eg).  3. Gallstones: No acute evidence of cholecystitis however the patient does have gallstones within the neck of the gallbladder. 4. Chest pain: The patient has a history of coronary artery disease. Homans elevated likely from sepsis syndrome however we will continue to follow cardiac biomarkers. The patient took aspirin 325 mg at home as well as sublingual nitroglycerin which relieved his pain. I started the patient on Plavix and we'll obtain a cardiology consult. 5. Congestive heart failure: Systolic; stable. Continue diuretic therapy per home regimen. Gentle IV hydration for kidney protection following dye load from CT scan. 6. Hyperlipidemia: Continue statin therapy 7. DVT prophylaxis: Heparin 8. GI prophylaxis: Pantoprazole The patient is a DO NOT RESUSCITATE. Time spent on admission orders and critical patient care 45 minutes.  Harrie Foreman 07/15/2015, 7:01 AM

## 2015-07-15 NOTE — Progress Notes (Signed)
Dr. Juliann Paresallwood notified of most recent troponin 5.02

## 2015-07-15 NOTE — ED Provider Notes (Signed)
[facility] Emergency Department Provider Note  ____________________________________________  Time seen: Approximately 1:32 AM  I have reviewed the triage vital signs and the nursing notes.   HISTORY  Chief Complaint Abdominal Pain    HPI Craig Gallegos is a 81 y.o. male who presents to the ED from home via EMS with a chief complaint of epigastric pain. Patient has a history of CAD, CHF, known AAA (4 cm on a checkup 6 months prior) who awoke this morning with epigastric discomfort. Describes nonradiating "strong pain" not associated with nausea, vomiting or diaphoresis. Patient took nitroglycerin and aspirin prior to arrival which partially relieved his pain; currently rates his pain 3/10. Denies recent travel or trauma. Denies fever, chills, chest pain, shortness of breath, dysuria, diarrhea, constipation.   Past Medical History  Diagnosis Date  . Coronary artery disease   . Hypertension   . CHF (congestive heart failure) ([facility])   . BPH (benign prostatic hyperplasia)     Patient Active Problem List   Diagnosis Date Noted  . Chronic systolic heart failure ([facility]) 06/12/2015  . Essential hypertension 06/12/2015    Past Surgical History  Procedure Laterality Date  . Coronary angioplasty  1986    Current Outpatient Rx  Name  Route  Sig  Dispense  Refill  . acetaminophen (TYLENOL) 325 MG tablet   Oral   Take 2 tablets (650 mg total) by mouth every 6 (six) hours as needed for mild pain (or Fever >/= 101).   30 tablet   0   . aspirin EC 325 MG tablet   Oral   Take 325 mg by mouth daily.         . cholecalciferol (VITAMIN D) 1000 UNITS tablet   Oral   Take 1,000 Units by mouth at bedtime.         . furosemide (LASIX) 40 MG tablet   Oral   Take 1 tablet (40 mg total) by mouth daily. Patient taking differently: Take 40 mg by mouth daily as needed. Pt takes one tablet by mouth daily if weight exceeds 169 lbs.   30 tablet   0   . losartan  (COZAAR) 100 MG tablet   Oral   Take 100 mg by mouth daily.         . metoprolol succinate (TOPROL-XL) 50 MG 24 hr tablet   Oral   Take 50 mg by mouth daily.         . Multiple Vitamins-Minerals (PRESERVISION AREDS 2 PO)   Oral   Take 1 tablet by mouth at bedtime.         . nitroGLYCERIN (NITROSTAT) 0.4 MG SL tablet   Sublingual   Place 0.4 mg under the tongue every 5 (five) minutes as needed for chest pain.          Marland Kitchen. omeprazole (PRILOSEC) 20 MG capsule   Oral   Take 20 mg by mouth at bedtime.         . simvastatin (ZOCOR) 20 MG tablet   Oral   Take 20 mg by mouth at bedtime.           Allergies Horse-derived products  Family History  Problem Relation Age of Onset  . Heart failure Father   . Heart failure Brother     Social History Social History  Substance Use Topics  . Smoking status: Former Smoker    Types: Pipe  . Smokeless tobacco: None  . Alcohol Use: 1.8 oz/week  3 Glasses of wine per week    Review of Systems Constitutional: No fever/chills Eyes: No visual changes. ENT: No sore throat. Cardiovascular: Denies chest pain. Respiratory: Denies shortness of breath. Gastrointestinal: Positive for abdominal pain.  No nausea, no vomiting.  No diarrhea.  No constipation. Genitourinary: Negative for dysuria. Musculoskeletal: Negative for back pain. Skin: Negative for rash. Neurological: Negative for headaches, focal weakness or numbness.  10-point ROS otherwise negative.  ____________________________________________   PHYSICAL EXAM:  VITAL SIGNS: ED Triage Vitals  Enc Vitals Group     BP --      Pulse Rate 07/15/15 0128 81     Resp 07/15/15 0128 14     Temp 07/15/15 0128 97.6 F (36.4 C)     Temp Source 07/15/15 0128 Oral     SpO2 07/15/15 0128 95 %     Weight --      Height --      Head Cir --      Peak Flow --      Pain Score 07/15/15 0129 4     Pain Loc --      Pain Edu? --      Excl. in GC? --     Constitutional:  Alert and oriented. Well appearing and in no acute distress. Eyes: Conjunctivae are normal. PERRL. EOMI. Head: Atraumatic. Nose: No congestion/rhinnorhea. Mouth/Throat: Mucous membranes are moist.  Oropharynx non-erythematous. Neck: No stridor.   Cardiovascular: Normal rate, irregular rhythm. II/VI SEM.  Good peripheral circulation. Respiratory: Normal respiratory effort.  No retractions. Lungs CTAB. Gastrointestinal: Soft and mildly tender to palpation epigastrium without rebound or guarding. No distention. No abdominal bruits. No CVA tenderness. Musculoskeletal: No lower extremity tenderness nor edema.  No joint effusions. Neurologic:  Normal speech and language. No gross focal neurologic deficits are appreciated.  Skin:  Skin is warm, dry and intact. No rash noted. Psychiatric: Mood and affect are normal. Speech and behavior are normal.  ____________________________________________   LABS (all labs ordered are listed, but only abnormal results are displayed)  Labs Reviewed  CBC WITH DIFFERENTIAL/PLATELET - Abnormal; Notable for the following:    HCT 39.0 (*)    RDW 16.1 (*)    Platelets 126 (*)    All other components within normal limits  COMPREHENSIVE METABOLIC PANEL - Abnormal; Notable for the following:    Glucose, Bld 126 (*)    BUN 35 (*)    Creatinine, Ser 1.53 (*)    Calcium 8.6 (*)    Total Protein 6.1 (*)    AST 84 (*)    GFR calc non Af Amer 37 (*)    GFR calc Af Amer 43 (*)    Anion gap 3 (*)    All other components within normal limits  TROPONIN I - Abnormal; Notable for the following:    Troponin I 0.05 (*)    All other components within normal limits  CULTURE, BLOOD (ROUTINE X 2)  CULTURE, BLOOD (ROUTINE X 2)  LIPASE, BLOOD  PROTIME-INR  URINALYSIS COMPLETEWITH MICROSCOPIC ([facility] ONLY)   ____________________________________________  EKG  ED ECG REPORT I, SUNG,JADE J, the attending physician, personally viewed and interpreted this ECG.   Date:  07/15/2015  EKG Time: 0124  Rate: 71  Rhythm: atrial fibrillation, rate 71  Axis: LAD  Intervals:left bundle branch block  ST&T Change: Nonspecific New-onset atrial fibrillation compared to prior EKGs; most recently September 2016. Previously existing left bundle branch block. ____________________________________________  RADIOLOGY  CT angiogram abdomen pelvis discussed with Dr.  Watts: 1. Mild pancreatitis. Please confirm with serum enzymes. 2. 4 cm infrarenal aortic aneurysm. 3. Cholelithiasis. 4. Colonic diverticulosis.  Portable chest x-ray (viewed by me, interpreted per Dr. Grace Isaac): 1. No acute finding. 2. Cardiomegaly and tortuous aorta.  RUQ ultrasound interpreted per Dr. Grace Isaac: Nonmobile cholelithiasis. No evidence of acute cholecystitis. ____________________________________________   PROCEDURES  Procedure(s) performed: None  Critical Care performed: Yes, see critical care note(s)   CRITICAL CARE Performed by: Irean Hong   Total critical care time: 30 minutes  Critical care time was exclusive of separately billable procedures and treating other patients.  Critical care was necessary to treat or prevent imminent or life-threatening deterioration.  Critical care was time spent personally by me on the following activities: development of treatment plan with patient and/or surrogate as well as nursing, discussions with consultants, evaluation of patient's response to treatment, examination of patient, obtaining history from patient or surrogate, ordering and performing treatments and interventions, ordering and review of laboratory studies, ordering and review of radiographic studies, pulse oximetry and re-evaluation of patient's condition.  ____________________________________________   INITIAL IMPRESSION / ASSESSMENT AND PLAN / ED COURSE  Pertinent labs & imaging results that were available during my care of the patient were reviewed by me and considered in my  medical decision making (see chart for details).  81 year old male with known AAA who presents with epigastric discomfort. Pain was partially relieved with aspirin and nitroglycerin taken prior to arrival. Will obtain emergent CT abdomen/pelvis to evaluate for rupture; check screening lab work including troponin.  ----------------------------------------- 1:42 AM on 07/15/2015 -----------------------------------------  Discussed with CT tech; patient had recent labs in September which demonstrated a low GFR. Given the patient is hemodynamically stable and appears clinically comfortable, I think it is reasonable to proceed first with a noncontrast CT abdomen/pelvis for gross evaluation of aneurysmal size and retroperitoneal hemorrhage. If there is any concern on that CT for leaking or rupture of his aneurysm, we will proceed to CT with IV contrast.  ----------------------------------------- 1:55 AM on 07/15/2015 -----------------------------------------  Discuss with CT tech again; patient had more recent labs in October 2016 which demonstrate improved renal function. Will proceed with CT angiogram with reduced IV dye load.  ----------------------------------------- 2:30 AM on 07/15/2015 -----------------------------------------  Discussed CT findings for mild pancreatitis with Dr. Grace Isaac.  ----------------------------------------- 3:30 AM on 07/15/2015 -----------------------------------------  Discussed with Dr. Clint Guy who will evaluate patient in the ED for admission.  ----------------------------------------- 3:46 AM on 07/15/2015 -----------------------------------------  Patient is tachycardic, vomiting, shivering consistent with rigors. Given findings of cholelithiasis on CT scan, epigastric abdominal pain associated with vomiting, will obtain bedside right upper quadrant ultrasound to evaluate for cholecystitis. IV fluids  infusing.  ----------------------------------------- 4:15 AM on 07/15/2015 -----------------------------------------  Heart rate normal, patient resting comfortably. Denies pain or nausea. ____________________________________________   FINAL CLINICAL IMPRESSION(S) / ED DIAGNOSES  Final diagnoses:  Pain  Chest pain, unspecified chest pain type  Elevated troponin  Acute pancreatitis, unspecified pancreatitis type  New onset atrial fibrillation [facility])  Cholelithiasis    Irean Hong, MD 07/15/15 [phone]

## 2015-07-15 NOTE — Consult Note (Signed)
Surgery Consult Note  CC: RUQ pain, now resolved, gallstones  HPI: [name] is a pleasant 81 yo M with a history of abdominal aneurysm who presented after awakening with abdominal pain.  Resolved with 1 dose of morphine in the ER and none since.  Pain was under ribcage and bilateral.  C/o chills then.  Never had this pain before.  No fevers/chills, night sweats, shortness of breath, cough, chest pain, abdominal pain, nausea/vomiting, diarrhea/constipation, dysuria/hematuria.  U/S shows nonmobile gallstones.  Troponin elevated.  Active Ambulatory Problems    Diagnosis Date Noted  . Chronic systolic heart failure (HCC) 06/12/2015  . Essential hypertension 06/12/2015   Resolved Ambulatory Problems    Diagnosis Date Noted  . Acute on chronic systolic and diastolic heart failure, NYHA class 2 (HCC) 05/25/2015   Past Medical History  Diagnosis Date  . Coronary artery disease   . Hypertension   . CHF (congestive heart failure) (HCC)   . BPH (benign prostatic hyperplasia)    Past Surgical History  Procedure Laterality Date  . Coronary angioplasty  1986      Medication List    ASK your doctor about these medications        acetaminophen 325 MG tablet  Commonly known as:  TYLENOL  Take 2 tablets (650 mg total) by mouth every 6 (six) hours as needed for mild pain (or Fever >/= 101).     aspirin EC 325 MG tablet  Take 325 mg by mouth daily.     cholecalciferol 1000 UNITS tablet  Commonly known as:  VITAMIN D  Take 1,000 Units by mouth at bedtime.     furosemide 40 MG tablet  Commonly known as:  LASIX  Take 1 tablet (40 mg total) by mouth daily.     losartan 100 MG tablet  Commonly known as:  COZAAR  Take 100 mg by mouth daily.     metoprolol succinate 50 MG 24 hr tablet  Commonly known as:  TOPROL-XL  Take 50 mg by mouth daily.     nitroGLYCERIN 0.4 MG SL tablet  Commonly known as:  NITROSTAT  Place 0.4 mg under the tongue every 5 (five) minutes as needed for chest  pain.     omeprazole 20 MG capsule  Commonly known as:  PRILOSEC  Take 20 mg by mouth at bedtime.     PRESERVISION AREDS 2 PO  Take 1 tablet by mouth at bedtime.     simvastatin 20 MG tablet  Commonly known as:  ZOCOR  Take 20 mg by mouth at bedtime.       Allergies  Allergen Reactions  . Horse-Derived Products Hives    Horse serum     Social History   Social History  . Marital Status: Widowed    Spouse Name: N/A  . Number of Children: N/A  . Years of Education: N/A   Occupational History  . Not on file.   Social History Main Topics  . Smoking status: Former Smoker    Types: Pipe  . Smokeless tobacco: Not on file  . Alcohol Use: 1.8 oz/week    3 Glasses of wine per week  . Drug Use: No  . Sexual Activity: Not on file   Other Topics Concern  . Not on file   Social History Narrative   Family History  Problem Relation Age of Onset  . Heart failure Father   . Heart failure Brother    ROS: Full ROS obtained, pertinent positives and negatives as  above  Blood pressure 147/52, pulse 85, temperature 98.7 F (37.1 C), temperature source Oral, resp. rate 20, height  (1.778 m), weight 174 lb 12.8 oz (79.289 kg), SpO2 98 %. GEN: NAD/A&Ox3 FACE: no obvious facial trauma, normal external nose, normal external ears EYES: no scleral icterus, no conjunctivitis HEAD: normocephalic atraumatic CV: RRR, no MRG RESP: moving air well, lungs clear ABD: soft, nontender, nondistended EXT: moving all ext well, strength 5/5 NEURO: cnII-XII grossly intact, sensation intact all 4 ext  Labs: Personally reviewed, significant for WBC 5.8 (61% neutrophils) AST/ALT/Bilirubin/Lipase - all wnl Troponin 5.02  Imaging: Cholelithiasis with nonmobile stones in gallbladder neck  A/P 81 yo admit with abdominal pain, now resolved.  + gallstones but no pain.  Pain not likely due to gallbladder disease, is no longer present and his elevated troponins is a contraindication to any  possible operative intervention.  NO indication for surgery.

## 2015-07-15 NOTE — Progress Notes (Signed)
[facility] - [place] at [facility]   PATIENT NAME: Craig Gallegos    MR#:  [id number]  DATE OF BIRTH:  27-Jan-1920  SUBJECTIVE:  CHIEF COMPLAINT:   Chief Complaint  Patient presents with  . Abdominal Pain   no complaint.  REVIEW OF SYSTEMS:  CONSTITUTIONAL: No fever, fatigue or weakness.  EYES: No blurred or double vision.  EARS, NOSE, AND THROAT: No tinnitus or ear pain.  RESPIRATORY: No cough, shortness of breath, wheezing or hemoptysis.  CARDIOVASCULAR: No chest pain, orthopnea, edema.  GASTROINTESTINAL: No nausea, vomiting, diarrhea or abdominal pain.  GENITOURINARY: No dysuria, hematuria.  ENDOCRINE: No polyuria, nocturia,  HEMATOLOGY: No anemia, easy bruising or bleeding SKIN: No rash or lesion. MUSCULOSKELETAL: No joint pain or arthritis.   NEUROLOGIC: No tingling, numbness, weakness.  PSYCHIATRY: No anxiety or depression.   DRUG ALLERGIES:   Allergies  Allergen Reactions  . Horse-Derived Products Hives    Horse serum     VITALS:  Blood pressure 112/62, pulse 68, temperature 99.5 F (37.5 C), temperature source Oral, resp. rate 17, height  (1.778 m), weight 79.289 kg (174 lb 12.8 oz), SpO2 98 %.  PHYSICAL EXAMINATION:  GENERAL:  81 y.o.-year-old patient lying in the bed with no acute distress.  EYES: Pupils equal, round, reactive to light and accommodation. No scleral icterus. Extraocular muscles intact.  HEENT: Head atraumatic, normocephalic. Oropharynx and nasopharynx clear. Moist oral mucosa. NECK:  Supple, no jugular venous distention. No thyroid enlargement, no tenderness.  LUNGS: Normal breath sounds bilaterally, no wheezing, rales,rhonchi or crepitation. No use of accessory muscles of respiration.  CARDIOVASCULAR: S1, S2 normal. No murmurs, rubs, or gallops.  ABDOMEN: Soft, nontender, nondistended. Bowel sounds present. No organomegaly or mass.  EXTREMITIES: No pedal edema, cyanosis, or clubbing.  NEUROLOGIC: Cranial  nerves II through XII are intact. Muscle strength 5/5 in all extremities. Sensation intact. Gait not checked.  PSYCHIATRIC: The patient is alert and oriented x 3.  SKIN: No obvious rash, lesion, or ulcer.    LABORATORY PANEL:   CBC  Recent Labs Lab 07/15/15 0143  WBC 5.8  HGB 13.2  HCT 39.0*  PLT 126*   ------------------------------------------------------------------------------------------------------------------  Chemistries   Recent Labs Lab 07/15/15 0143  NA 140  K 3.8  CL 110  CO2 27  GLUCOSE 126*  BUN 35*  CREATININE 1.53*  CALCIUM 8.6*  AST 84*  ALT 34  ALKPHOS 111  BILITOT 0.9   ------------------------------------------------------------------------------------------------------------------  Cardiac Enzymes  Recent Labs Lab 07/15/15 1328  TROPONINI 6.23*   ------------------------------------------------------------------------------------------------------------------  RADIOLOGY:  Ct Angio Abdomen W/cm &/or Wo Contrast  07/15/2015  CLINICAL DATA:  Epigastric pain awakening patient from sleep. History of abdominal aortic aneurysm. EXAM: CT ANGIOGRAPHY ABDOMEN TECHNIQUE: Multidetector CT imaging of the abdomen was performed using the standard protocol during bolus administration of intravenous contrast. Multiplanar reconstructed images including MIPs were obtained and reviewed to evaluate the vascular anatomy. CONTRAST:  80mL OMNIPAQUE IOHEXOL 350 MG/ML SOLN COMPARISON:  None. FINDINGS: Lower chest and abdominal wall:  Left inguinal hernia repair Hepatobiliary: No focal liver abnormality.Faceted high density gallstones within the noninflamed gallbladder. Pancreas: Mild edema around the pancreatic head and body. No fluid collection or ductal enlargement. Lipase is currently pending. Spleen: Unremarkable. Adrenals/Urinary Tract: Negative adrenals. Mild bilateral renal atrophy with cystic change in the right renal cortex. No hydronephrosis. Mild bladder wall  thickening circumferentially, likely from chronic outlet obstruction. Reproductive:Symmetric prostate enlargement. Stomach/Bowel: 10 mm Ovoid high-density structure in the peripheral gastric body was  initially concerning for a pseudoaneurysm given isodensity to the arteries (with surrounding heterogeneously high density luminal contents potentially indicating hematoma) but no vessel continuity and after discussion with Dr. Dolores FrameSung there is no indication of GI hemorrhage. This is presumably ingested material with pill. Extensive distal colonic diverticulosis without active inflammation. No bowel obstruction. No pericecal inflammatory change. Vascular/Lymphatic: Fusiform abdominal aortic aneurysm measuring 4 cm in maximal dimension. The aneurysm contacts the third portion duodenum without high density small bowel contents or extraluminal gas. Atherosclerosis throughout the abdominal aorta and branch vessels No mass or adenopathy. Peritoneal: No ascites or pneumoperitoneum. Musculoskeletal: Diffuse and advanced lumbar disc and facet degeneration with grade 1 anterolisthesis at L5-S1. Degenerative changes have progressed since 2008 lumbar spine MRI. These results were called by telephone at the time of interpretation on 07/15/2015 at 2:26 am to Dr. Chiquita LothJADE SUNG , who verbally acknowledged these results. Review of the MIP images confirms the above findings. IMPRESSION: 1. Mild pancreatitis.  Please confirm with serum enzymes. 2. 4 cm infrarenal aortic aneurysm. 3. Cholelithiasis. 4. Colonic diverticulosis. Electronically Signed   By: Marnee SpringJonathon  Watts M.D.   On: 07/15/2015 02:33   Dg Chest Port 1 View  07/15/2015  CLINICAL DATA:  Epigastric pain. EXAM: PORTABLE CHEST 1 VIEW COMPARISON:  05/25/2015 FINDINGS: Chronic cardiomegaly with aortic tortuosity. Prominent right mediastinal contours from tortuous and potentially ectatic ascending aorta, appearance stable from prior. Similar appearance of prominent central vessels. There  is no edema, consolidation, effusion, or pneumothorax. IMPRESSION: 1. No acute finding. 2. Cardiomegaly and tortuous aorta. Electronically Signed   By: Marnee SpringJonathon  Watts M.D.   On: 07/15/2015 02:01   [place] Abdomen Limited Ruq  07/15/2015  CLINICAL DATA:  Gallstones on CT.  Rigors. EXAM: US ABDOMEN LIMITED - RIGHT UPPER QUADRANT COMPARISON:  CT from earlier today FINDINGS: Gallbladder: Multiple shadowing gallstones which do not move from the gallbladder neck. No wall thickening or focal tenderness. No pericholecystic edema. Common bile duct: Diameter: 5 mm were visualized. Liver: No focal lesion identified. Within normal limits in parenchymal echogenicity. Antegrade flow in the imaged portal venous system. IMPRESSION: Nonmobile cholelithiasis.  No evidence of acute cholecystitis. Electronically Signed   By: Marnee SpringJonathon  Watts M.D.   On: 07/15/2015 05:33    EKG:   Orders placed or performed during the hospital encounter of 07/15/15  . EKG 12-Lead  . EKG 12-Lead    ASSESSMENT AND PLAN:   1. Non-STEMI and CAD. Medical management per Dr. Juliann Paresallwood. Continue aspirin, Plavix and Lipitor. Follow-up Troponin level.  2. Cholelithiasis without cholecystitis. No indication for surgery at this time per Dr. Sharmon RevereLindquist.  3. AAA: Stable in size.  4. Chronic systolic Congestive heart failure. Continue diuretic therapy per home regimen.   5. Hyperlipidemia: Continue statin therapy  * Hypertension. Controlled, continue Cozaar and metoprolol.  * Acute renal failure. Hold Lasix and continue gentle IV rehydration. Follow up BMP.   No evidence of Sepsis: discontinue broad-spectrum antibiotics. The patient is hemodynamically stable.  Discussed with Dr. Sharmon RevereLindquist. All the records are reviewed and case discussed with Care Management/Social Workerr. Management plans discussed with the patient, his wife and they are in agreement. Greater than 50% time was spent on coordination of care and face-to-face  counseling.  CODE STATUS: Full code  TOTAL TIME TAKING CARE OF THIS PATIENT: 43 minutes.   POSSIBLE D/C IN 2 DAYS, DEPENDING ON CLINICAL CONDITION.   Shaune Pollackhen, Sada Mazzoni M.D on 07/15/2015 at 5:25 PM  Between 7am to 6pm - Pager - [phone]  After  6pm go to [email or web address] - password [email or web address] [facility]  [facility]  Office  [phone]  CC: Primary care physician; Lynnea Ferrier, MD

## 2015-07-15 NOTE — ED Notes (Signed)
Pt c/o worsening pain and nausea.  Pt states pain is worse when he breathes in.  Pt tachypneic and pale.  Pt dry heaving at this time.  MD notified and medications order and given.  Pt appears to be resting more at this time.  O2 applied for pt comfort.  Pt sitting up to help with breathing and prevent aspiration.  Pt appears to be resting at this time.  Pt states that pain is easing off and that nausea is easing off as well.

## 2015-07-15 NOTE — Progress Notes (Signed)
Per Dr. Juliann Paresallwood, no cardiac interventions at this time. Can give patient heart healthy diet

## 2015-07-15 NOTE — Care Management Obs Status (Signed)
MEDICARE OBSERVATION STATUS NOTIFICATION   Patient Details  Name: Orville GovernLee F Hibbitts MRN: [id number] Date of Birth: 1919/11/14   Medicare Observation Status Notification Given:  Yes  Patient admitted under observation.  Presented and explained observation notice.  Patient/family signed.  Original given to patient and copy placed in medical record.  Copy manually delivered to HIM     Eber HongGreene, Devonta Blanford R, RN 07/15/2015, 8:15 AM

## 2015-07-15 NOTE — Progress Notes (Signed)
Patient rested quietly today. No complaints of chest pain. Friend at bedside most of the day. Dr. Juliann Paresallwood following patient - aware of troponins trending upward, ordered a fourth to be drawn at 2315 tonight.

## 2015-07-15 NOTE — ED Notes (Signed)
Blood cultures collected.  UA collect.  Pt resting comfortably at this time.  Pt is no longer shivering and in NAD for the time being.

## 2015-07-15 NOTE — Consult Note (Signed)
Reason for Consult: elevated troponin chest pain Referring Physician:  Dr. Marcille Blanco  hospitalist  Craig Gallegos is an 81 y.o. male.  HPI:  The patient working with abdominal discomfort he has a known history of 4 cm abdominal aortic aneurysm. Patient has history of cardiomyopathy congestive heart failure ejection fraction known to be around 25%. Patient working with abdominal discomfort CT of abdomen showed stable size aneurysm without rupture. Study suggested gallstones patient began having chills and rigors he was started on broad-spectrum antibiotics and right upper quadrant ultrasound was ordered for evaluation of possibly acute cholecystitis. Patient denied discreet chest pain he has had dyspnea on exertion. He has not needed cardiac evaluation for quite some time.  Last PCI  Was around 1986.  Past Medical History  Diagnosis Date  . Coronary artery disease   . Hypertension   . CHF (congestive heart failure) ([place])   . BPH (benign prostatic hyperplasia)     Past Surgical History  Procedure Laterality Date  . Coronary angioplasty  1986    Family History  Problem Relation Age of Onset  . Heart failure Father   . Heart failure Brother     Social History:  reports that he has quit smoking. His smoking use included Pipe. He does not have any smokeless tobacco history on file. He reports that he drinks about 1.8 oz of alcohol per week. He reports that he does not use illicit drugs.  Allergies:  Allergies  Allergen Reactions  . Horse-Derived Products Hives    Horse serum     Medications: I have reviewed the patient's current medications.  Results for orders placed or performed during the hospital encounter of 07/15/15 (from the past 48 hour(s))  CBC with Differential     Status: Abnormal   Collection Time: 07/15/15  1:43 AM  Result Value Ref Range   WBC 5.8 3.8 - 10.6 K/uL   RBC 4.62 4.40 - 5.90 MIL/uL   Hemoglobin 13.2 13.0 - 18.0 g/dL   HCT 39.0 (L) 40.0 - 52.0 %   MCV  84.4 80.0 - 100.0 fL   MCH 28.7 26.0 - 34.0 pg   MCHC 34.0 32.0 - 36.0 g/dL   RDW 16.1 (H) 11.5 - 14.5 %   Platelets 126 (L) 150 - 440 K/uL   Neutrophils Relative % 61 %   Neutro Abs 3.5 1.4 - 6.5 K/uL   Lymphocytes Relative 19 %   Lymphs Abs 1.1 1.0 - 3.6 K/uL   Monocytes Relative 11 %   Monocytes Absolute 0.6 0.2 - 1.0 K/uL   Eosinophils Relative 8 %   Eosinophils Absolute 0.5 0 - 0.7 K/uL   Basophils Relative 1 %   Basophils Absolute 0.0 0 - 0.1 K/uL  Comprehensive metabolic panel     Status: Abnormal   Collection Time: 07/15/15  1:43 AM  Result Value Ref Range   Sodium 140 135 - 145 mmol/L   Potassium 3.8 3.5 - 5.1 mmol/L   Chloride 110 101 - 111 mmol/L   CO2 27 22 - 32 mmol/L   Glucose, Bld 126 (H) 65 - 99 mg/dL   BUN 35 (H) 6 - 20 mg/dL   Creatinine, Ser 1.53 (H) 0.61 - 1.24 mg/dL   Calcium 8.6 (L) 8.9 - 10.3 mg/dL   Total Protein 6.1 (L) 6.5 - 8.1 g/dL   Albumin 3.7 3.5 - 5.0 g/dL   AST 84 (H) 15 - 41 U/L   ALT 34 17 - 63 U/L   Alkaline  Phosphatase 111 38 - 126 U/L   Total Bilirubin 0.9 0.3 - 1.2 mg/dL   GFR calc non Af Amer 37 (L) >60 mL/min   GFR calc Af Amer 43 (L) >60 mL/min    Comment: (NOTE) The eGFR has been calculated using the CKD EPI equation. This calculation has not been validated in all clinical situations. eGFR's persistently <60 mL/min signify possible Chronic Kidney Disease.    Anion gap 3 (L) 5 - 15  Lipase, blood     Status: None   Collection Time: 07/15/15  1:43 AM  Result Value Ref Range   Lipase 49 11 - 51 U/L  Troponin I     Status: Abnormal   Collection Time: 07/15/15  1:43 AM  Result Value Ref Range   Troponin I 0.05 (H) <0.031 ng/mL    Comment: CRITICAL RESULT CALLED TO, READ BACK BY AND VERIFIED WITH  [facility] YUAL AT 7829 07/15/15 WDM        PERSISTENTLY INCREASED TROPONIN VALUES IN THE RANGE OF 0.04-0.49 ng/mL CAN BE SEEN IN:       -UNSTABLE ANGINA       -CONGESTIVE HEART FAILURE       -MYOCARDITIS       -CHEST TRAUMA        -ARRYHTHMIAS       -LATE PRESENTING MYOCARDIAL INFARCTION       -COPD   CLINICAL FOLLOW-UP RECOMMENDED.   Protime-INR     Status: None   Collection Time: 07/15/15  1:43 AM  Result Value Ref Range   Prothrombin Time 14.8 11.4 - 15.0 seconds   INR 1.14   Urinalysis complete, with microscopic ([facility] only)     Status: Abnormal   Collection Time: 07/15/15  3:56 AM  Result Value Ref Range   Color, Urine YELLOW (A) YELLOW   APPearance CLEAR (A) CLEAR   Glucose, UA NEGATIVE NEGATIVE mg/dL   Bilirubin Urine NEGATIVE NEGATIVE   Ketones, ur NEGATIVE NEGATIVE mg/dL   Specific Gravity, Urine 1.036 (H) 1.005 - 1.030   Hgb urine dipstick NEGATIVE NEGATIVE   pH 5.0 5.0 - 8.0   Protein, ur NEGATIVE NEGATIVE mg/dL   Nitrite NEGATIVE NEGATIVE   Leukocytes, UA NEGATIVE NEGATIVE   RBC / HPF 0-5 0 - 5 RBC/hpf   WBC, UA NONE SEEN 0 - 5 WBC/hpf   Bacteria, UA NONE SEEN NONE SEEN   Squamous Epithelial / LPF NONE SEEN NONE SEEN   Mucous PRESENT   TSH     Status: None   Collection Time: 07/15/15  7:29 AM  Result Value Ref Range   TSH 0.793 0.350 - 4.500 uIU/mL  Troponin I     Status: Abnormal   Collection Time: 07/15/15  7:29 AM  Result Value Ref Range   Troponin I 5.02 (H) <0.031 ng/mL    Comment: READ BACK AND VERIFIED WITH [place] BAILEY  @ [phone] 07/15/15 BY [facility]        POSSIBLE MYOCARDIAL ISCHEMIA. SERIAL TESTING RECOMMENDED.   MRSA PCR Screening     Status: None   Collection Time: 07/15/15  9:45 AM  Result Value Ref Range   MRSA by PCR NEGATIVE NEGATIVE    Comment:        The GeneXpert MRSA Assay (FDA approved for NASAL specimens only), is one component of a comprehensive MRSA colonization surveillance program. It is not intended to diagnose MRSA infection nor to guide or monitor treatment for MRSA infections.     Ct Angio Abdomen W/cm &/  or Wo Contrast  07/15/2015  CLINICAL DATA:  Epigastric pain awakening patient from sleep. History of abdominal aortic aneurysm. EXAM: CT  ANGIOGRAPHY ABDOMEN TECHNIQUE: Multidetector CT imaging of the abdomen was performed using the standard protocol during bolus administration of intravenous contrast. Multiplanar reconstructed images including MIPs were obtained and reviewed to evaluate the vascular anatomy. CONTRAST:  44m OMNIPAQUE IOHEXOL 350 MG/ML SOLN COMPARISON:  None. FINDINGS: Lower chest and abdominal wall:  Left inguinal hernia repair Hepatobiliary: No focal liver abnormality.Faceted high density gallstones within the noninflamed gallbladder. Pancreas: Mild edema around the pancreatic head and body. No fluid collection or ductal enlargement. Lipase is currently pending. Spleen: Unremarkable. Adrenals/Urinary Tract: Negative adrenals. Mild bilateral renal atrophy with cystic change in the right renal cortex. No hydronephrosis. Mild bladder wall thickening circumferentially, likely from chronic outlet obstruction. Reproductive:Symmetric prostate enlargement. Stomach/Bowel: 10 mm Ovoid high-density structure in the peripheral gastric body was initially concerning for a pseudoaneurysm given isodensity to the arteries (with surrounding heterogeneously high density luminal contents potentially indicating hematoma) but no vessel continuity and after discussion with Dr. SBeather Arbourthere is no indication of GI hemorrhage. This is presumably ingested material with pill. Extensive distal colonic diverticulosis without active inflammation. No bowel obstruction. No pericecal inflammatory change. Vascular/Lymphatic: Fusiform abdominal aortic aneurysm measuring 4 cm in maximal dimension. The aneurysm contacts the third portion duodenum without high density small bowel contents or extraluminal gas. Atherosclerosis throughout the abdominal aorta and branch vessels No mass or adenopathy. Peritoneal: No ascites or pneumoperitoneum. Musculoskeletal: Diffuse and advanced lumbar disc and facet degeneration with grade 1 anterolisthesis at L5-S1. Degenerative changes  have progressed since 2008 lumbar spine MRI. These results were called by telephone at the time of interpretation on 07/15/2015 at 2:26 am to Dr. JLurline Hare, who verbally acknowledged these results. Review of the MIP images confirms the above findings. IMPRESSION: 1. Mild pancreatitis.  Please confirm with serum enzymes. 2. 4 cm infrarenal aortic aneurysm. 3. Cholelithiasis. 4. Colonic diverticulosis. Electronically Signed   By: JMonte FantasiaM.D.   On: 07/15/2015 02:33   Dg Chest Port 1 View  07/15/2015  CLINICAL DATA:  Epigastric pain. EXAM: PORTABLE CHEST 1 VIEW COMPARISON:  05/25/2015 FINDINGS: Chronic cardiomegaly with aortic tortuosity. Prominent right mediastinal contours from tortuous and potentially ectatic ascending aorta, appearance stable from prior. Similar appearance of prominent central vessels. There is no edema, consolidation, effusion, or pneumothorax. IMPRESSION: 1. No acute finding. 2. Cardiomegaly and tortuous aorta. Electronically Signed   By: JMonte FantasiaM.D.   On: 07/15/2015 02:01   [place]dom[place] Limited Ruq  07/15/2015  CLINICAL DATA:  Gallstones on CT.  Rigors. EXAM: [place]DOMEN LIMITED - RIGHT UPPER QUADRANT COMPARISON:  CT from earlier today FINDINGS: Gallbladder: Multiple shadowing gallstones which do not move from the gallbladder neck. No wall thickening or focal tenderness. No pericholecystic edema. Common bile duct: Diameter: 5 mm were visualized. Liver: No focal lesion identified. Within normal limits in parenchymal echogenicity. Antegrade flow in the imaged portal venous system. IMPRESSION: Nonmobile cholelithiasis.  No evidence of acute cholecystitis. Electronically Signed   By: JMonte FantasiaM.D.   On: 07/15/2015 05:33    Review of Systems  Constitutional: Positive for malaise/fatigue.  HENT: Positive for congestion.   Eyes: Negative.   Respiratory: Positive for shortness of breath.   Cardiovascular: Positive for chest pain.  Gastrointestinal: Positive for  heartburn and abdominal pain.  Skin: Negative.   Neurological: Positive for weakness.   Blood pressure 112/62, pulse 68, temperature 99.5  F (37.5 C), temperature source Oral, resp. rate 17, height _0  (1.778 m), weight 79.289 kg (174 lb 12.8 oz), SpO2 98 %. Physical Exam  Constitutional: He is oriented to person, place, and time. He appears well-developed and well-nourished.  HENT:  Head: Normocephalic and atraumatic.  Eyes: Conjunctivae and EOM are normal. Pupils are equal, round, and reactive to light.  Neck: Normal range of motion. Neck supple.  Cardiovascular: Normal rate.  Exam reveals gallop.   Murmur heard. Respiratory: Effort normal and breath sounds normal.  GI: Soft. He exhibits distension. There is tenderness.  Musculoskeletal: Normal range of motion.  Neurological: He is alert and oriented to person, place, and time. He has normal reflexes.  Skin: Skin is warm and dry.  Psychiatric: He has a normal mood and affect.    Assessment/Plan: Elevated troponin  possible non STEMI  abdominal pain  cardiomyopathy  hypertension  chronic systolic heart failure compensated  hyperlipidemia  GERD and reflux  coronary artery disease  angina  abdominal aortic aneurysm without rupture  possible acute cholecystitis . PLAN  agree with admitting to telemetry  anticoagulation with heparin short-term  hypertension control with metoprolol losartan  continue aspirin Plavix therapy for coronary disease  agree with simvastatin for lipid management  continue Lasix for heart failure cardiomyopathy  recommend GI for evaluation of abdominal pain  nitroglycerin as needed for possible angina  antibiotic therapy for possible diverticulitis  conservative cardiology therapy for elevated troponin possibly demand ischemia  CALLWOOD,DWAYNE D. 07/15/2015, 1:00 PM

## 2015-07-15 NOTE — ED Notes (Signed)
Friend at bedside, concerned that pt is shivering.  Dr Dolores FrameSung notified, rectal temp obtained. Pt vomited.  Another dose of zofran given per Dr Dolores FrameSung.

## 2015-07-15 NOTE — Progress Notes (Signed)
Arrival Method: via stretcher with ED techs Mental Orientation: A&O Telemetry: [id number] Skin: intact, sacrum red but blanchable, skin verified by Salli QuarryAdrienne White, RN IV: 20g left AC Pain:  No pain Safety Measures: Safety Fall Prevention Plan has been given, discussed & signed, non skid socks in place, bed alarm activated.  Orders have been reviewed & implemented. Will continue to monitor the patient. Call light has been placed within reach.  Eden LatheLexi Miller, RN

## 2015-07-15 NOTE — Progress Notes (Signed)
Lab called with positive blood cultures, gram negative rods in both anaerobic & aerobic bottles, MD paged, Dr. Anne HahnWillis orders to resend blood cultures & to reorder the IV zosyn that had been discontinued. No other concerns at this time. Will continue to monitor. Shirley FriarAlexis Miller, RN

## 2015-07-15 NOTE — Progress Notes (Addendum)
Spoke with Dr. Juliann Paresallwood regarding third troponin elevated at 6.23 - Instructed to order another one to be drawn 8 hours from now, any earlier would be too soon per MD.

## 2015-07-15 NOTE — Progress Notes (Signed)
ANTIBIOTIC CONSULT NOTE - INITIAL  Pharmacy Consult for Zosyn Indication: IAI  Allergies  Allergen Reactions  . Horse-Derived Products Hives    Horse serum     Patient Measurements: Height: 5\' 10"  (177.8 cm) Weight: 174 lb 12.8 oz (79.289 kg) IBW/kg (Calculated) : 73 Adjusted Body Weight:   Vital Signs: Temp: 98.7 F (37.1 C) (11/09 0533) Temp Source: Oral (11/09 0533) BP: 147/52 mmHg (11/09 0533) Pulse Rate: 85 (11/09 0533) Intake/Output from previous day:   Intake/Output from this shift:    Labs:  Recent Labs  07/15/15 0143  WBC 5.8  HGB 13.2  PLT 126*  CREATININE 1.53*   Estimated Creatinine Clearance: 31.1 mL/min (by C-G formula based on Cr of 1.53). No results for input(s): VANCOTROUGH, VANCOPEAK, VANCORANDOM, GENTTROUGH, GENTPEAK, GENTRANDOM, TOBRATROUGH, TOBRAPEAK, TOBRARND, AMIKACINPEAK, AMIKACINTROU, AMIKACIN in the last 72 hours.   Microbiology: No results found for this or any previous visit (from the past 720 hour(s)).  Medical History: Past Medical History  Diagnosis Date  . Coronary artery disease   . Hypertension   . CHF (congestive heart failure) ([facility])   . BPH (benign prostatic hyperplasia)     Medications:  Infusions:  . sodium chloride     Assessment: 89 yom to ED from EMS with epigastric pain that woke him up, hx AAA. SL nitro relieved pain at home but worsening in ED. Starting Zosyn for IAI  Goal of Therapy:    Plan:  Expected duration 5 days with resolution of temperature and/or normalization of WBC. Zosyn 3.375 gm IV Q8H EI.   Carola FrostNathan A Ashritha Desrosiers, Pharm.D.  Clinical Pharmacist 07/15/2015,5:41 AM

## 2015-07-15 NOTE — ED Notes (Signed)
Pt presents to ED via EMS with c/o epigastric pain that woke him from sleep. Hx of triple AAA. Per EMS, pt taken 325mg  ASA and sublingual nitro x1 that eased pain from 8/10 to 4/10. BP-165/72, HR-83, CBG-143.

## 2015-07-15 NOTE — Care Management (Signed)
[facility].  Resides in one of the villas independently.  One of his neighbors- Larey SeatFaye Alexander is at bedside.  Patient is currently on 02 which is acute.  He says he is independent in all of his adls and he is still able to drive.  He has three sons that live out of state.  The primary contact is Suzy BouchardFrank Medlen [place]  [phone].  Patient gives permission for members of care team to speak with his family members.  Patient was admitted under observation and received obs notice but since then, has ruled in for nstemi per attending.  Now meets inpatient criteria.  Obtained order for inpatient and informed patient.  Cardiology consult order is present.  GI consult present for choledocholithiasis and AAA

## 2015-07-16 LAB — C-REACTIVE PROTEIN: CRP: 9.2 mg/dL — ABNORMAL HIGH (ref <1.0)

## 2015-07-16 LAB — URINE CULTURE
CULTURE: NO GROWTH
SPECIAL REQUESTS: NORMAL

## 2015-07-16 LAB — BASIC METABOLIC PANEL
ANION GAP: 5 (ref 5–15)
BUN: 39 mg/dL — AB (ref 6–20)
CALCIUM: 7.8 mg/dL — AB (ref 8.9–10.3)
CO2: 26 mmol/L (ref 22–32)
Chloride: 111 mmol/L (ref 101–111)
Creatinine, Ser: 2.17 mg/dL — ABNORMAL HIGH (ref 0.61–1.24)
GFR calc Af Amer: 28 mL/min — ABNORMAL LOW (ref >60)
GFR, EST NON AFRICAN AMERICAN: 25 mL/min — AB (ref >60)
GLUCOSE: 107 mg/dL — AB (ref 65–99)
Potassium: 4 mmol/L (ref 3.5–5.1)
Sodium: 142 mmol/L (ref 135–145)

## 2015-07-16 LAB — TROPONIN I: TROPONIN I: 3.84 ng/mL — AB (ref <0.031)

## 2015-07-16 MED ORDER — SODIUM CHLORIDE 0.45 % IV SOLN
INTRAVENOUS | Status: DC
  Administered 2015-07-16 – 2015-07-17 (×2): via INTRAVENOUS

## 2015-07-16 NOTE — Progress Notes (Signed)
[facility] [place] at [facility]   PATIENT NAME: Craig Gallegos    MR#:  [id number]  DATE OF BIRTH:  10/08/1919  SUBJECTIVE:  CHIEF COMPLAINT:   Chief Complaint  Patient presents with  . Abdominal Pain   no complaint.  REVIEW OF SYSTEMS:  CONSTITUTIONAL: No fever, fatigue or weakness.  EYES: No blurred or double vision.  EARS, NOSE, AND THROAT: No tinnitus or ear pain.  RESPIRATORY: No cough, shortness of breath, wheezing or hemoptysis.  CARDIOVASCULAR: No chest pain, orthopnea, edema.  GASTROINTESTINAL: No nausea, vomiting, diarrhea or abdominal pain.  GENITOURINARY: No dysuria, hematuria.  ENDOCRINE: No polyuria, nocturia,  HEMATOLOGY: No anemia, easy bruising or bleeding SKIN: No rash or lesion. MUSCULOSKELETAL: No joint pain or arthritis.   NEUROLOGIC: No tingling, numbness, weakness.  PSYCHIATRY: No anxiety or depression.   DRUG ALLERGIES:   Allergies  Allergen Reactions  . Horse-Derived Products Hives    Horse serum     VITALS:  Blood pressure 105/42, pulse 60, temperature 97.5 F (36.4 C), temperature source Oral, resp. rate 17, height  (1.778 m), weight 79.788 kg (175 lb 14.4 oz), SpO2 97 %.  PHYSICAL EXAMINATION:  GENERAL:  81 y.o.-year-old patient lying in the bed with no acute distress.  EYES: Pupils equal, round, reactive to light and accommodation. No scleral icterus. Extraocular muscles intact.  HEENT: Head atraumatic, normocephalic. Oropharynx and nasopharynx clear. Moist oral mucosa. NECK:  Supple, no jugular venous distention. No thyroid enlargement, no tenderness.  LUNGS: Normal breath sounds bilaterally, no wheezing, rales,rhonchi or crepitation. No use of accessory muscles of respiration.  CARDIOVASCULAR: S1, S2 normal. No murmurs, rubs, or gallops.  ABDOMEN: Soft, nontender, nondistended. Bowel sounds present. No organomegaly or mass.  EXTREMITIES: No pedal edema, cyanosis, or clubbing.  NEUROLOGIC: Cranial  nerves II through XII are intact. Muscle strength 5/5 in all extremities. Sensation intact. Gait not checked.  PSYCHIATRIC: The patient is alert and oriented x 3.  SKIN: No obvious rash, lesion, or ulcer.    LABORATORY PANEL:   CBC  Recent Labs Lab 07/15/15 0143  WBC 5.8  HGB 13.2  HCT 39.0*  PLT 126*   ------------------------------------------------------------------------------------------------------------------  Chemistries   Recent Labs Lab 07/15/15 0143 07/16/15 0444  NA 140 142  K 3.8 4.0  CL 110 111  CO2 27 26  GLUCOSE 126* 107*  BUN 35* 39*  CREATININE 1.53* 2.17*  CALCIUM 8.6* 7.8*  AST 84*  --   ALT 34  --   ALKPHOS 111  --   BILITOT 0.9  --    ------------------------------------------------------------------------------------------------------------------  Cardiac Enzymes  Recent Labs Lab 07/15/15 2328  TROPONINI 3.84*   ------------------------------------------------------------------------------------------------------------------  RADIOLOGY:  Ct Angio Abdomen W/cm &/or Wo Contrast  07/15/2015  CLINICAL DATA:  Epigastric pain awakening patient from sleep. History of abdominal aortic aneurysm. EXAM: CT ANGIOGRAPHY ABDOMEN TECHNIQUE: Multidetector CT imaging of the abdomen was performed using the standard protocol during bolus administration of intravenous contrast. Multiplanar reconstructed images including MIPs were obtained and reviewed to evaluate the vascular anatomy. CONTRAST:  80mL OMNIPAQUE IOHEXOL 350 MG/ML SOLN COMPARISON:  None. FINDINGS: Lower chest and abdominal wall:  Left inguinal hernia repair Hepatobiliary: No focal liver abnormality.Faceted high density gallstones within the noninflamed gallbladder. Pancreas: Mild edema around the pancreatic head and body. No fluid collection or ductal enlargement. Lipase is currently pending. Spleen: Unremarkable. Adrenals/Urinary Tract: Negative adrenals. Mild bilateral renal atrophy with cystic  change in the right renal cortex. No hydronephrosis. Mild bladder wall  thickening circumferentially, likely from chronic outlet obstruction. Reproductive:Symmetric prostate enlargement. Stomach/Bowel: 10 mm Ovoid high-density structure in the peripheral gastric body was initially concerning for a pseudoaneurysm given isodensity to the arteries (with surrounding heterogeneously high density luminal contents potentially indicating hematoma) but no vessel continuity and after discussion with Dr. Dolores FrameSung there is no indication of GI hemorrhage. This is presumably ingested material with pill. Extensive distal colonic diverticulosis without active inflammation. No bowel obstruction. No pericecal inflammatory change. Vascular/Lymphatic: Fusiform abdominal aortic aneurysm measuring 4 cm in maximal dimension. The aneurysm contacts the third portion duodenum without high density small bowel contents or extraluminal gas. Atherosclerosis throughout the abdominal aorta and branch vessels No mass or adenopathy. Peritoneal: No ascites or pneumoperitoneum. Musculoskeletal: Diffuse and advanced lumbar disc and facet degeneration with grade 1 anterolisthesis at L5-S1. Degenerative changes have progressed since 2008 lumbar spine MRI. These results were called by telephone at the time of interpretation on 07/15/2015 at 2:26 am to Dr. Chiquita LothJADE SUNG , who verbally acknowledged these results. Review of the MIP images confirms the above findings. IMPRESSION: 1. Mild pancreatitis.  Please confirm with serum enzymes. 2. 4 cm infrarenal aortic aneurysm. 3. Cholelithiasis. 4. Colonic diverticulosis. Electronically Signed   By: Marnee SpringJonathon  Watts M.D.   On: 07/15/2015 02:33   Dg Chest Port 1 View  07/15/2015  CLINICAL DATA:  Epigastric pain. EXAM: PORTABLE CHEST 1 VIEW COMPARISON:  05/25/2015 FINDINGS: Chronic cardiomegaly with aortic tortuosity. Prominent right mediastinal contours from tortuous and potentially ectatic ascending aorta, appearance  stable from prior. Similar appearance of prominent central vessels. There is no edema, consolidation, effusion, or pneumothorax. IMPRESSION: 1. No acute finding. 2. Cardiomegaly and tortuous aorta. Electronically Signed   By: Marnee SpringJonathon  Watts M.D.   On: 07/15/2015 02:01   [place] Abdomen Limited Ruq  07/15/2015  CLINICAL DATA:  Gallstones on CT.  Rigors. EXAM: US ABDOMEN LIMITED - RIGHT UPPER QUADRANT COMPARISON:  CT from earlier today FINDINGS: Gallbladder: Multiple shadowing gallstones which do not move from the gallbladder neck. No wall thickening or focal tenderness. No pericholecystic edema. Common bile duct: Diameter: 5 mm were visualized. Liver: No focal lesion identified. Within normal limits in parenchymal echogenicity. Antegrade flow in the imaged portal venous system. IMPRESSION: Nonmobile cholelithiasis.  No evidence of acute cholecystitis. Electronically Signed   By: Marnee SpringJonathon  Watts M.D.   On: 07/15/2015 05:33    EKG:   Orders placed or performed during the hospital encounter of 07/15/15  . EKG 12-Lead  . EKG 12-Lead    ASSESSMENT AND PLAN:   1. Non-STEMI and CAD. Medical management per Dr. Juliann Paresallwood. Continue aspirin, Plavix and Lipitor. Troponin level is trending down.  2. Cholelithiasis without cholecystitis. Asymptomatic. No indication for surgery at this time per Dr. Sharmon RevereLindquist.  3. AAA: Stable in size.  4. Chronic systolic Congestive heart failure. Continue diuretic therapy per home regimen.   5. Hyperlipidemia: Continue statin therapy  * Hypertension. Controlled, Bp is low side, discontinue Cozaar and metoprolol.  * Acute renal failure. Hold Lasix and continue gentle IV rehydration. Follow up BMP. * Bacteremia. Blood cultures show GNR. Follow-up sensitivity.  Continue Zosyn and ID consult.   All the records are reviewed and case discussed with Care Management/Social Workerr. Management plans discussed with the patient, his wife and they are in agreement. Greater than  50% time was spent on coordination of care and face-to-face counseling.  CODE STATUS: Full code  TOTAL TIME TAKING CARE OF THIS PATIENT: 38 minutes.   POSSIBLE D/C IN 2  DAYS, DEPENDING ON CLINICAL CONDITION.   Shaune Pollack M.D on 07/16/2015 at 1:54 PM  Between 7am to 6pm - Pager - [phone]  After 6pm go to [email or web address] - password [email or web address] [facility]  [facility]  Office  [phone]  CC: Primary care physician; Lynnea Ferrier, MD

## 2015-07-16 NOTE — Care Management (Signed)
Patient has positive blood cultures.  Discussed with primary nurse and attending of need for patient to ambulate in the halls to prevent decline in functional status.  This patient at baseline is very independent and no physical limitations

## 2015-07-17 DIAGNOSIS — R7881 Bacteremia: Secondary | ICD-10-CM | POA: Diagnosis present

## 2015-07-17 LAB — BASIC METABOLIC PANEL
ANION GAP: 8 (ref 5–15)
BUN: 38 mg/dL — ABNORMAL HIGH (ref 6–20)
CHLORIDE: 109 mmol/L (ref 101–111)
CO2: 22 mmol/L (ref 22–32)
Calcium: 7.8 mg/dL — ABNORMAL LOW (ref 8.9–10.3)
Creatinine, Ser: 2.05 mg/dL — ABNORMAL HIGH (ref 0.61–1.24)
GFR calc Af Amer: 30 mL/min — ABNORMAL LOW (ref >60)
GFR calc non Af Amer: 26 mL/min — ABNORMAL LOW (ref >60)
GLUCOSE: 118 mg/dL — AB (ref 65–99)
POTASSIUM: 3.6 mmol/L (ref 3.5–5.1)
Sodium: 139 mmol/L (ref 135–145)

## 2015-07-17 LAB — CULTURE, BLOOD (ROUTINE X 2)

## 2015-07-17 LAB — CBC
HEMATOCRIT: 34.5 % — AB (ref 40.0–52.0)
HEMOGLOBIN: 11.7 g/dL — AB (ref 13.0–18.0)
MCH: 28.9 pg (ref 26.0–34.0)
MCHC: 34 g/dL (ref 32.0–36.0)
MCV: 85.1 fL (ref 80.0–100.0)
Platelets: 87 10*3/uL — ABNORMAL LOW (ref 150–440)
RBC: 4.05 MIL/uL — ABNORMAL LOW (ref 4.40–5.90)
RDW: 16.2 % — ABNORMAL HIGH (ref 11.5–14.5)
WBC: 7.6 10*3/uL (ref 3.8–10.6)

## 2015-07-17 MED ORDER — CLOPIDOGREL BISULFATE 75 MG PO TABS: 75.0000 mg | ORAL_TABLET | Freq: Every day | ORAL | Status: DC

## 2015-07-17 MED ORDER — ATORVASTATIN CALCIUM 40 MG PO TABS: 40.0000 mg | ORAL_TABLET | Freq: Every day | ORAL | Status: DC

## 2015-07-17 MED ORDER — CIPROFLOXACIN HCL 500 MG PO TABS: 500.0000 mg | ORAL_TABLET | Freq: Two times a day (BID) | ORAL | Status: DC

## 2015-07-17 NOTE — Care Management Important Message (Signed)
Important Message  Patient Details  Name: Craig Gallegos MRN: [id number] Date of Birth: 02-May-1920   Medicare Important Message Given:  Yes    Eber HongGreene, Zaylan Kissoon R, RN 07/17/2015, 9:06 AM

## 2015-07-17 NOTE — Discharge Summary (Signed)
[facility] [place] at [facility]   PATIENT NAME: Craig Gallegos    MR#:  [id number]  DATE OF BIRTH:  1920-01-16  DATE OF ADMISSION:  07/15/2015 ADMITTING PHYSICIAN: Arnaldo Natal, MD  DATE OF DISCHARGE: 07/17/2015  1:08 PM  PRIMARY CARE PHYSICIAN: Curtis Sites III, MD    ADMISSION DIAGNOSIS:  Pain [R52] New onset atrial fibrillation (HCC) [I48.91] Elevated troponin [R79.89] Acute pancreatitis, unspecified pancreatitis type [K85.9] Chest pain, unspecified chest pain type [R07.9]   DISCHARGE DIAGNOSIS:  Non-STEMI Acute renal failure on CKD stage III. Cholelithiasis without cholecystitis Bacteremia  SECONDARY DIAGNOSIS:   Past Medical History  Diagnosis Date  . Coronary artery disease   . Hypertension   . CHF (congestive heart failure) (HCC)   . BPH (benign prostatic hyperplasia)     HOSPITAL COURSE:   1. Non-STEMI and CAD. Troponin was up to 6.23. Medical management per Dr. Juliann Pares. The patient has been treated with aspirin, Plavix and Lipitor. Troponin level is trending down to 3.84. The patient denies any chest pain or other symptoms.  2. Cholelithiasis without cholecystitis. Asymptomatic. No indication for surgery at this time per Dr. Sharmon Revere.  3. AAA: Stable in size.  4. Chronic systolic Congestive heart failure. Continue diuretic therapy per home regimen.   5. Hyperlipidemia: Continue statin therapy  * Hypertension. Controlled, continue Cozaar and metoprolol.  * Acute renal failure on CKD stage III. The patient was treated with gentle IV rehydration.renal function is better. Follow up BMP as outpatient.  * Bacteremia. Blood cultures show Escherichia coli,  Sensitive to many antibiotics.the patient was treated with Zosyn. I discussed with Dr. Sampson Goon, will change to Cipro by mouth twice a day for 10 days.   DISCHARGE CONDITIONS:   Stable, discharged to home today.  CONSULTS OBTAINED:  Treatment Team:  Clydie Braun, MD  DRUG ALLERGIES:   Allergies  Allergen Reactions  . Horse-Derived Products Hives    Horse serum     DISCHARGE MEDICATIONS:   Discharge Medication List as of 07/17/2015 11:06 AM    START taking these medications   Details  atorvastatin (LIPITOR) 40 MG tablet Take 1 tablet (40 mg total) by mouth daily at 6 PM., Starting 07/17/2015, Until Discontinued, Print    ciprofloxacin (CIPRO) 500 MG tablet Take 1 tablet (500 mg total) by mouth 2 (two) times daily., Starting 07/17/2015, Until Discontinued, Print    clopidogrel (PLAVIX) 75 MG tablet Take 1 tablet (75 mg total) by mouth daily., Starting 07/17/2015, Until Discontinued, Print      CONTINUE these medications which have NOT CHANGED   Details  acetaminophen (TYLENOL) 325 MG tablet Take 2 tablets (650 mg total) by mouth every 6 (six) hours as needed for mild pain (or Fever >/= 101)., Starting 05/26/2015, Until Discontinued, Normal    aspirin EC 325 MG tablet Take 325 mg by mouth daily., Until Discontinued, Historical Med    cholecalciferol (VITAMIN D) 1000 UNITS tablet Take 1,000 Units by mouth at bedtime., Until Discontinued, Historical Med    furosemide (LASIX) 40 MG tablet Take 1 tablet (40 mg total) by mouth daily., Starting 05/26/2015, Until Discontinued, Normal    losartan (COZAAR) 100 MG tablet Take 100 mg by mouth daily., Until Discontinued, Historical Med    metoprolol succinate (TOPROL-XL) 50 MG 24 hr tablet Take 50 mg by mouth daily., Until Discontinued, Historical Med    Multiple Vitamins-Minerals (PRESERVISION AREDS 2 PO) Take 1 tablet by mouth at bedtime., Until Discontinued, Historical Med  nitroGLYCERIN (NITROSTAT) 0.4 MG SL tablet Place 0.4 mg under the tongue every 5 (five) minutes as needed for chest pain. , Until Discontinued, Historical Med    omeprazole (PRILOSEC) 20 MG capsule Take 20 mg by mouth at bedtime., Until Discontinued, Historical Med      STOP taking these medications      simvastatin (ZOCOR) 20 MG tablet          DISCHARGE INSTRUCTIONS:    If you experience worsening of your admission symptoms, develop shortness of breath, life threatening emergency, suicidal or homicidal thoughts you must seek medical attention immediately by calling 911 or calling your MD immediately  if symptoms less severe.  You Must read complete instructions/literature along with all the possible adverse reactions/side effects for all the Medicines you take and that have been prescribed to you. Take any new Medicines after you have completely understood and accept all the possible adverse reactions/side effects.   Please note  You were cared for by a hospitalist during your hospital stay. If you have any questions about your discharge medications or the care you received while you were in the hospital after you are discharged, you can call the unit and asked to speak with the hospitalist on call if the hospitalist that took care of you is not available. Once you are discharged, your primary care physician will handle any further medical issues. Please note that NO REFILLS for any discharge medications will be authorized once you are discharged, as it is imperative that you return to your primary care physician (or establish a relationship with a primary care physician if you do not have one) for your aftercare needs so that they can reassess your need for medications and monitor your lab values.    Today   SUBJECTIVE    No complaint.  VITAL SIGNS:  Blood pressure 118/38, pulse 55, temperature 97.2 F (36.2 C), temperature source Axillary, resp. rate 26, height  (1.778 m), weight 79.788 kg (175 lb 14.4 oz), SpO2 97 %.  I/O:   Intake/Output Summary (Last 24 hours) at 07/17/15 1601 Last data filed at 07/17/15 0900  Gross per 24 hour  Intake    910 ml  Output    300 ml  Net    610 ml    PHYSICAL EXAMINATION:  GENERAL:  81 y.o.-year-old patient lying in the bed with no  acute distress.  EYES: Pupils equal, round, reactive to light and accommodation. No scleral icterus. Extraocular muscles intact.  HEENT: Head atraumatic, normocephalic. Oropharynx and nasopharynx clear. Muscle mucosa. NECK:  Supple, no jugular venous distention. No thyroid enlargement, no tenderness.  LUNGS: Normal breath sounds bilaterally, no wheezing, rales,rhonchi or crepitation. No use of accessory muscles of respiration.  CARDIOVASCULAR: S1, S2 normal. No murmurs, rubs, or gallops.  ABDOMEN: Soft, non-tender, non-distended. Bowel sounds present. No organomegaly or mass.  EXTREMITIES: No pedal edema, cyanosis, or clubbing.  NEUROLOGIC: Cranial nerves II through XII are intact. Muscle strength 5/5 in all extremities. Sensation intact. Gait not checked.  PSYCHIATRIC: The patient is alert and oriented x 3.  SKIN: No obvious rash, lesion, or ulcer.   DATA REVIEW:   CBC  Recent Labs Lab 07/17/15 0447  WBC 7.6  HGB 11.7*  HCT 34.5*  PLT 87*    Chemistries   Recent Labs Lab 07/15/15 0143  07/17/15 0447  NA 140  < > 139  K 3.8  < > 3.6  CL 110  < > 109  CO2 27  < >  22  GLUCOSE 126*  < > 118*  BUN 35*  < > 38*  CREATININE 1.53*  < > 2.05*  CALCIUM 8.6*  < > 7.8*  AST 84*  --   --   ALT 34  --   --   ALKPHOS 111  --   --   BILITOT 0.9  --   --   < > = values in this interval not displayed.  Cardiac Enzymes  Recent Labs Lab 07/15/15 2328  TROPONINI 3.84*    Microbiology Results  Results for orders placed or performed during the hospital encounter of 07/15/15  Urine culture     Status: None   Collection Time: 07/15/15  3:56 AM  Result Value Ref Range Status   Specimen Description URINE, RANDOM  Final   Special Requests Normal  Final   Culture NO GROWTH 1 DAY  Final   Report Status 07/16/2015 FINAL  Final  Culture, blood (routine x 2)     Status: None   Collection Time: 07/15/15  4:23 AM  Result Value Ref Range Status   Specimen Description BLOOD RIGHT  ANTECUBITAL  Final   Special Requests BOTTLES DRAWN AEROBIC AND ANAEROBIC 10ML  Final   Culture  Setup Time   Final    GRAM NEGATIVE RODS IN BOTH AEROBIC AND ANAEROBIC BOTTLES CRITICAL RESULT CALLED TO, READ BACK BY AND VERIFIED WITH: [facility] AT [id number] 07/15/15 SDR CONFIRMED BY TFK    Culture   Final    ESCHERICHIA COLI IN BOTH AEROBIC AND ANAEROBIC BOTTLES    Report Status 07/17/2015 FINAL  Final   Organism ID, Bacteria ESCHERICHIA COLI  Final      Susceptibility   Escherichia coli - MIC*    AMPICILLIN 8 SENSITIVE Sensitive     CEFTAZIDIME <=1 SENSITIVE Sensitive     CEFAZOLIN <=4 SENSITIVE Sensitive     CEFTRIAXONE <=1 SENSITIVE Sensitive     CIPROFLOXACIN <=0.25 SENSITIVE Sensitive     GENTAMICIN <=1 SENSITIVE Sensitive     IMIPENEM <=0.25 SENSITIVE Sensitive     TRIMETH/SULFA <=20 SENSITIVE Sensitive     PIP/TAZO Value in next row Sensitive      SENSITIVE<=4    * ESCHERICHIA COLI  Culture, blood (routine x 2)     Status: None   Collection Time: 07/15/15  4:23 AM  Result Value Ref Range Status   Specimen Description BLOOD LEFT ANTECUBITAL  Final   Special Requests BOTTLES DRAWN AEROBIC AND ANAEROBIC 5ML  Final   Culture  Setup Time   Final    GRAM NEGATIVE RODS IN BOTH AEROBIC AND ANAEROBIC BOTTLES CRITICAL RESULT CALLED TO, READ BACK BY AND VERIFIED WITH:  [facility] AT [id number] 07/15/15 SDR CONFIRMED BY AJO    Culture   Final    ESCHERICHIA COLI IN BOTH AEROBIC AND ANAEROBIC BOTTLES    Report Status 07/17/2015 FINAL  Final   Organism ID, Bacteria ESCHERICHIA COLI  Final      Susceptibility   Escherichia coli - MIC*    AMPICILLIN <=2 SENSITIVE Sensitive     CEFTAZIDIME <=1 SENSITIVE Sensitive     CEFAZOLIN <=4 SENSITIVE Sensitive     CEFTRIAXONE <=1 SENSITIVE Sensitive     CIPROFLOXACIN <=0.25 SENSITIVE Sensitive     GENTAMICIN <=1 SENSITIVE Sensitive     IMIPENEM <=0.25 SENSITIVE Sensitive     TRIMETH/SULFA <=20 SENSITIVE Sensitive     PIP/TAZO Value in next  row Sensitive      SENSITIVE<=4    *  ESCHERICHIA COLI  MRSA PCR Screening     Status: None   Collection Time: 07/15/15  9:45 AM  Result Value Ref Range Status   MRSA by PCR NEGATIVE NEGATIVE Final    Comment:        The GeneXpert MRSA Assay (FDA approved for NASAL specimens only), is one component of a comprehensive MRSA colonization surveillance program. It is not intended to diagnose MRSA infection nor to guide or monitor treatment for MRSA infections.   Culture, blood (routine x 2)     Status: None (Preliminary result)   Collection Time: 07/15/15 11:28 PM  Result Value Ref Range Status   Specimen Description BLOOD RIGHT ANTECUBITAL  Final   Special Requests BOTTLES DRAWN AEROBIC AND ANAEROBIC  Final   Culture NO GROWTH 2 DAYS  Final   Report Status PENDING  Incomplete  Culture, blood (routine x 2)     Status: None (Preliminary result)   Collection Time: 07/15/15 11:28 PM  Result Value Ref Range Status   Specimen Description BLOOD RIGHT HAND  Final   Special Requests   Final    BOTTLES DRAWN AEROBIC AND ANAEROBIC ,   Culture NO GROWTH 2 DAYS  Final   Report Status PENDING  Incomplete    RADIOLOGY:  No results found.      Management plans discussed with the patient, family and they are in agreement.  CODE STATUS:     Code Status Orders        Start     Ordered   07/15/15 0907  Full code   Continuous     07/15/15 0908      TOTAL TIME TAKING CARE OF THIS PATIENT: 35 minutes.    Shaune Pollack M.D on 07/17/2015 at 4:01 PM  Between 7am to 6pm - Pager - [phone]  After 6pm go to [email or web address] - password [email or web address] [facility]  [place]  [facility]  Office  [phone]  CC: Primary care physician; Lynnea Ferrier, MD

## 2015-07-17 NOTE — Discharge Instructions (Signed)
Heart healthy diet. °Activity as tolerated. °

## 2015-07-17 NOTE — Care Management (Signed)
Nursing stated that patient has ambulated around the nurses station without difficulty

## 2015-07-20 LAB — CULTURE, BLOOD (ROUTINE X 2)
CULTURE: NO GROWTH
CULTURE: NO GROWTH

## 2015-10-02 ENCOUNTER — Emergency Department
Admission: EM | Admit: 2015-10-02 | Discharge: 2015-10-02 | Disposition: A | Discharge status: Home / Self Care | Payer: Medicare Other | Attending: Emergency Medicine | Admitting: Emergency Medicine

## 2015-10-02 ENCOUNTER — Emergency Department: Payer: Medicare Other

## 2015-10-02 DIAGNOSIS — Z7982 Long term (current) use of aspirin: Secondary | ICD-10-CM | POA: Diagnosis not present

## 2015-10-02 DIAGNOSIS — Z792 Long term (current) use of antibiotics: Secondary | ICD-10-CM | POA: Insufficient documentation

## 2015-10-02 DIAGNOSIS — I10 Essential (primary) hypertension: Secondary | ICD-10-CM | POA: Diagnosis not present

## 2015-10-02 DIAGNOSIS — I499 Cardiac arrhythmia, unspecified: Secondary | ICD-10-CM | POA: Diagnosis not present

## 2015-10-02 DIAGNOSIS — Z79899 Other long term (current) drug therapy: Secondary | ICD-10-CM | POA: Diagnosis not present

## 2015-10-02 DIAGNOSIS — R0602 Shortness of breath: Secondary | ICD-10-CM | POA: Diagnosis present

## 2015-10-02 DIAGNOSIS — R0600 Dyspnea, unspecified: Secondary | ICD-10-CM | POA: Insufficient documentation

## 2015-10-02 DIAGNOSIS — Z7902 Long term (current) use of antithrombotics/antiplatelets: Secondary | ICD-10-CM | POA: Diagnosis not present

## 2015-10-02 DIAGNOSIS — I509 Heart failure, unspecified: Secondary | ICD-10-CM | POA: Insufficient documentation

## 2015-10-02 DIAGNOSIS — Z87891 Personal history of nicotine dependence: Secondary | ICD-10-CM | POA: Insufficient documentation

## 2015-10-02 LAB — CBC WITH DIFFERENTIAL/PLATELET
Basophils Absolute: 0 10*3/uL (ref 0–0.1)
Basophils Relative: 0 %
Eosinophils Absolute: 0.2 10*3/uL (ref 0–0.7)
Eosinophils Relative: 2 %
HEMATOCRIT: 42.4 % (ref 40.0–52.0)
HEMOGLOBIN: 14.1 g/dL (ref 13.0–18.0)
LYMPHS ABS: 0.9 10*3/uL — AB (ref 1.0–3.6)
Lymphocytes Relative: 12 %
MCH: 28.5 pg (ref 26.0–34.0)
MCHC: 33.2 g/dL (ref 32.0–36.0)
MCV: 85.8 fL (ref 80.0–100.0)
MONO ABS: 0.6 10*3/uL (ref 0.2–1.0)
MONOS PCT: 8 %
NEUTROS ABS: 5.9 10*3/uL (ref 1.4–6.5)
NEUTROS PCT: 78 %
Platelets: 135 10*3/uL — ABNORMAL LOW (ref 150–440)
RBC: 4.94 MIL/uL (ref 4.40–5.90)
RDW: 15.9 % — AB (ref 11.5–14.5)
WBC: 7.6 10*3/uL (ref 3.8–10.6)

## 2015-10-02 LAB — BRAIN NATRIURETIC PEPTIDE: B Natriuretic Peptide: 3130 pg/mL — ABNORMAL HIGH (ref 0.0–100.0)

## 2015-10-02 LAB — COMPREHENSIVE METABOLIC PANEL
ALBUMIN: 4.1 g/dL (ref 3.5–5.0)
ALK PHOS: 71 U/L (ref 38–126)
ALT: 28 U/L (ref 17–63)
ANION GAP: 7 (ref 5–15)
AST: 21 U/L (ref 15–41)
BILIRUBIN TOTAL: 0.8 mg/dL (ref 0.3–1.2)
BUN: 36 mg/dL — ABNORMAL HIGH (ref 6–20)
CALCIUM: 9.1 mg/dL (ref 8.9–10.3)
CO2: 21 mmol/L — ABNORMAL LOW (ref 22–32)
Chloride: 114 mmol/L — ABNORMAL HIGH (ref 101–111)
Creatinine, Ser: 1.84 mg/dL — ABNORMAL HIGH (ref 0.61–1.24)
GFR, EST AFRICAN AMERICAN: 35 mL/min — AB (ref >60)
GFR, EST NON AFRICAN AMERICAN: 30 mL/min — AB (ref >60)
GLUCOSE: 114 mg/dL — AB (ref 65–99)
POTASSIUM: 5 mmol/L (ref 3.5–5.1)
Sodium: 142 mmol/L (ref 135–145)
TOTAL PROTEIN: 6.7 g/dL (ref 6.5–8.1)

## 2015-10-02 LAB — TROPONIN I
TROPONIN I: 0.06 ng/mL — AB (ref <0.031)
Troponin I: 0.06 ng/mL — ABNORMAL HIGH (ref <0.031)

## 2015-10-02 LAB — FIBRIN DERIVATIVES D-DIMER (ARMC ONLY): FIBRIN DERIVATIVES D-DIMER (ARMC): 1540 — AB (ref 0–499)

## 2015-10-02 MED ORDER — FUROSEMIDE 10 MG/ML IJ SOLN
20.0000 mg | Freq: Once | INTRAMUSCULAR | Status: AC
  Administered 2015-10-02: 20 mg via INTRAVENOUS
  Filled 2015-10-02: qty 4

## 2015-10-02 MED ORDER — TECHNETIUM TO 99M ALBUMIN AGGREGATED
4.2000 | Freq: Once | INTRAVENOUS | Status: AC | PRN
  Administered 2015-10-02: 4.2 via INTRAVENOUS

## 2015-10-02 MED ORDER — TECHNETIUM TC 99M DIETHYLENETRIAME-PENTAACETIC ACID
30.7000 | Freq: Once | INTRAVENOUS | Status: AC | PRN
  Administered 2015-10-02: 30.7 via RESPIRATORY_TRACT

## 2015-10-02 NOTE — ED Notes (Signed)
Patient transported to X-ray 

## 2015-10-02 NOTE — ED Provider Notes (Signed)
-----------------------------------------   6:26 PM on 10/02/2015 -----------------------------------------  This patient was seen by Dr. Huel Cote initially. Please see his history and physical for full details about today's encounter. He presented with dyspnea. Dr. Huel Cote had spoke with cardiology and arrange for a VQ scan. If the VQ scan is normal, I was advised the patient may be discharged home for follow-up.  At this time, the VQ scan is still pending. I have reviewed the patient's chart. His troponin was essentially normal at 0.06. As Dr. Huel Cote noted in his chart, he has low suspicion for an ischemic heart event and the minimal elevation of troponin is likely due to renal dysfunction. However, given the length of time since the patient's initial troponin, we will obtain a second one now to check for a delta.  ----------------------------------------- 8:11 PM on 10/02/2015 -----------------------------------------  VQ scan FINDINGS: Ventilation: No focal ventilation defect.  Perfusion: No wedge shaped peripheral perfusion defects to suggest acute pulmonary embolism.  IMPRESSION: No ventilation perfusion mismatch to suggest pulmonary embolism.  Second troponin is the same as the first, 0.06. No delta.  On reexamination, the patient appears comfortable. Had a pleasant discussion with him with his family members. I have offered admission to the hospital if he felt as though his breathing was still bothering him. The patient prefers to go home.   Darien Ramus, MD 10/02/15 2017

## 2015-10-02 NOTE — ED Notes (Signed)
Pt. Going home with family. 

## 2015-10-02 NOTE — ED Notes (Signed)
Pt c/o SOB since discharged from hospital in November, states worse this week.. Denies pain or swelling.Marland Kitchen

## 2015-10-02 NOTE — ED Notes (Signed)
Pt. Returned from nm pulmonary perf and vent.

## 2015-10-02 NOTE — ED Provider Notes (Signed)
Time Seen: Approximately ----------------------------------------- 1:36 PM on 10/02/2015 -----------------------------------------   I have reviewed the triage notes  Chief Complaint: Shortness of Breath   History of Present Illness: Craig Gallegos is a 82 y.o. male who presents with a several month history of dyspnea. The patient apparently has been evaluated by his primary physician and cardiologist. Patient was previously admitted here for pulmonary edema and coronary artery disease in the past. He has a known history of atrial fibrillation. Patient states he feels more short of breath with exertion. He denies any associated chest pain, arm pain or jaw pain. He denies any fever or chills or productive cough. He denies any peripheral edema calf tenderness or swelling. Outside of his hospitalization a week does not have any pulmonary emboli risk factors. I added non-ST elevated myocardial infarction back in November 2016.   Past Medical History  Diagnosis Date  . Coronary artery disease   . Hypertension   . CHF (congestive heart failure) (HCC)   . BPH (benign prostatic hyperplasia)     Patient Active Problem List   Diagnosis Date Noted  . Bacteremia 07/17/2015  . Chest pain 07/15/2015  . NSTEMI (non-ST elevated myocardial infarction) (HCC) 07/15/2015  . Chronic systolic heart failure (HCC) 06/12/2015  . Essential hypertension 06/12/2015    Past Surgical History  Procedure Laterality Date  . Coronary angioplasty  1986    Past Surgical History  Procedure Laterality Date  . Coronary angioplasty  1986    Current Outpatient Rx  Name  Route  Sig  Dispense  Refill  . acetaminophen (TYLENOL) 325 MG tablet   Oral   Take 2 tablets (650 mg total) by mouth every 6 (six) hours as needed for mild pain (or Fever >/= 101).   30 tablet   0   . aspirin EC 325 MG tablet   Oral   Take 325 mg by mouth daily.         Marland Kitchen atorvastatin (LIPITOR) 40 MG tablet   Oral   Take 1  tablet (40 mg total) by mouth daily at 6 PM.   30 tablet   0   . cholecalciferol (VITAMIN D) 1000 UNITS tablet   Oral   Take 1,000 Units by mouth at bedtime.         . ciprofloxacin (CIPRO) 500 MG tablet   Oral   Take 1 tablet (500 mg total) by mouth 2 (two) times daily.   20 tablet   0   . clopidogrel (PLAVIX) 75 MG tablet   Oral   Take 1 tablet (75 mg total) by mouth daily.   30 tablet   0   . furosemide (LASIX) 40 MG tablet   Oral   Take 1 tablet (40 mg total) by mouth daily. Patient taking differently: Take 40 mg by mouth daily as needed. Pt takes one tablet by mouth daily if weight exceeds 169 lbs.   30 tablet   0   . losartan (COZAAR) 100 MG tablet   Oral   Take 100 mg by mouth daily.         . metoprolol succinate (TOPROL-XL) 50 MG 24 hr tablet   Oral   Take 50 mg by mouth daily.         . Multiple Vitamins-Minerals (PRESERVISION AREDS 2 PO)   Oral   Take 1 tablet by mouth at bedtime.         . nitroGLYCERIN (NITROSTAT) 0.4 MG SL tablet   Sublingual  Place 0.4 mg under the tongue every 5 (five) minutes as needed for chest pain.          Marland Kitchen omeprazole (PRILOSEC) 20 MG capsule   Oral   Take 20 mg by mouth at bedtime.           Allergies:  Horse-derived products  Family History: Family History  Problem Relation Age of Onset  . Heart failure Father   . Heart failure Brother     Social History: Social History  Substance Use Topics  . Smoking status: Former Smoker    Types: Pipe  . Smokeless tobacco: Not on file  . Alcohol Use: 1.8 oz/week    3 Glasses of wine per week     Review of Systems:   10 point review of systems was performed and was otherwise negative:  Constitutional: No fever Eyes: No visual disturbances ENT: No sore throat, ear pain Cardiac: No chest pain Respiratory: Dyspnea with exertion, however does not exhibit any shortness of breath with rest wheezing, or stridor Abdomen: No abdominal pain, no vomiting, No  diarrhea Endocrine: No weight loss, No night sweats Extremities: No peripheral edema, cyanosis Skin: No rashes, easy bruising Neurologic: No focal weakness, trouble with speech or swollowing Urologic: No dysuria, Hematuria, or urinary frequency  Physical Exam:  ED Triage Vitals  Enc Vitals Group     BP 10/02/15 1259 155/98 mmHg     Pulse Rate 10/02/15 1259 55     Resp 10/02/15 1259 20     Temp 10/02/15 1259 97 F (36.1 C)     Temp Source 10/02/15 1259 Oral     SpO2 10/02/15 1259 99 %     Weight 10/02/15 1259 169 lb (76.658 kg)     Height 10/02/15 1259  (1.753 m)     Head Cir --      Peak Flow --      Pain Score --      Pain Loc --      Pain Edu? --      Excl. in GC? --     General: Awake , Alert , and Oriented times 3; GCS 15 Head: Normal cephalic , atraumatic Eyes: Pupils equal , round, reactive to light Nose/Throat: No nasal drainage, patent upper airway without erythema or exudate.  Neck: Supple, Full range of motion, No anterior adenopathy or palpable thyroid masses Lungs: Clear to ascultation without wheezes , rhonchi, or rales Heart: Regular rate, irregular rhythm with grade 2/6 systolic ejection murmur left sternal border , no gallops , or rubs Abdomen: Soft, non tender without rebound, guarding , or rigidity; bowel sounds positive and symmetric in all 4 quadrants. No organomegaly .        Extremities: 2 plus symmetric pulses. No edema, clubbing or cyanosis Neurologic: normal ambulation, Motor symmetric without deficits, sensory intact Skin: warm, dry, no rashes   Labs:   All laboratory work was reviewed including any pertinent negatives or positives listed below:  Labs Reviewed  BRAIN NATRIURETIC PEPTIDE  CBC WITH DIFFERENTIAL/PLATELET  COMPREHENSIVE METABOLIC PANEL  FIBRIN DERIVATIVES D-DIMER ([facility] ONLY)  TROPONIN I   review of the laboratory work shows an elevated d-dimer, slightly elevated troponin, and an elevated BNP  EKG:  ED ECG REPORT I,  Jennye Moccasin, the attending physician, personally viewed and interpreted this ECG.  Date: 10/02/2015 EKG Time: 1306 Rate: 79 Rhythm: Atrial fibrillation with frequent PVCs QRS Axis: Left axis deviation Intervals: normal ST/T Wave abnormalities: normal Conduction Disturbances: none  Narrative Interpretation: unremarkable  Radiology:      I personally reviewed the radiologic studies EXAM: CHEST 2 VIEW  COMPARISON: 07/15/2015  FINDINGS: Mild cardiomegaly. Peribronchial thickening. No confluent airspace opacities or effusions. No acute bony abnormality.  IMPRESSION: Cardiomegaly.  Bronchitic changes.     ED Course: * The patient was evaluated for dyspnea and given his differential there was concerns for pulmonary edema, acute myocardial infarction (unlikely), pulmonary embolism due to his recent hospital stay in November. The patient's D-dimer test is elevated though with really minimal risk factors. Given his history of renal insufficiency of ordered a VQ scan. If the VQ scan is inconclusive or negative he can be discharged. If his VQ is positive most likely require medicine evaluation at that time. Patient is hemodynamically stable and I felt given his overall clinical impression this most likely was congestive heart failure. He also has valvular heart disease but did not seem to be a significant murmur that would create a consistent dyspnea with exertion. The patient's case was briefly reviewed with his cardiologist on call. We agreed that they would call him on Monday for further outpatient follow-up. He takes Lasix as per his weight at home. I felt he should stick to that regimen and gave him a 20 mg IV bolus of Lasix here in emergency department. He should stick to his original scheduled. All questions and concerns were addressed at the bedside and the patient is awaiting his VQ scan at my departure from the emergency department. The VQ scan will be followed by my colleague  along with definitive disposition.* His troponin is only slightly elevated at felt given his renal insufficiency and past history illnesses none of significance require further assessment.   Assessment: Dyspnea with exertion History of congestive heart failure   Plan: VQ scan determine disposition Follow-up with cardiology is been arranged            Jennye Moccasin, MD 10/02/15 [phone]

## 2015-10-02 NOTE — ED Notes (Signed)
Patient notified of possible 2+ hour delay for nuclear med study. Patient provided tv remote, blankets, and [place] tray

## 2015-10-02 NOTE — Discharge Instructions (Signed)
Shortness of Breath Shortness of breath means you have trouble breathing. It could also mean that you have a medical problem. You should get immediate medical care for shortness of breath. CAUSES   Not enough oxygen in the air such as with high altitudes or a smoke-filled room.  Certain lung diseases, infections, or problems.  Heart disease or conditions, such as angina or heart failure.  Low red blood cells (anemia).  Poor physical fitness, which can cause shortness of breath when you exercise.  Chest or back injuries or stiffness.  Being overweight.  Smoking.  Anxiety, which can make you feel like you are not getting enough air. DIAGNOSIS  Serious medical problems can often be found during your physical exam. Tests may also be done to determine why you are having shortness of breath. Tests may include:  Chest X-rays.  Lung function tests.  Blood tests.  An electrocardiogram (ECG).  An ambulatory electrocardiogram. An ambulatory ECG records your heartbeat patterns over a 24-hour period.  Exercise testing.  A transthoracic echocardiogram (TTE). During echocardiography, sound waves are used to evaluate how blood flows through your heart.  A transesophageal echocardiogram (TEE).  Imaging scans. Your health care provider may not be able to find a cause for your shortness of breath after your exam. In this case, it is important to have a follow-up exam with your health care provider as directed.  TREATMENT  Treatment for shortness of breath depends on the cause of your symptoms and can vary greatly. HOME CARE INSTRUCTIONS   Do not smoke. Smoking is a common cause of shortness of breath. If you smoke, ask for help to quit.  Avoid being around chemicals or things that may bother your breathing, such as paint fumes and dust.  Rest as needed. Slowly resume your usual activities.  If medicines were prescribed, take them as directed for the full length of time directed. This  includes oxygen and any inhaled medicines.  Keep all follow-up appointments as directed by your health care provider. SEEK MEDICAL CARE IF:   Your condition does not improve in the time expected.  You have a hard time doing your normal activities even with rest.  You have any new symptoms. SEEK IMMEDIATE MEDICAL CARE IF:   Your shortness of breath gets worse.  You feel light-headed, faint, or develop a cough not controlled with medicines.  You start coughing up blood.  You have pain with breathing.  You have chest pain or pain in your arms, shoulders, or abdomen.  You have a fever.  You are unable to walk up stairs or exercise the way you normally do. MAKE SURE YOU:  Understand these instructions.  Will watch your condition.  Will get help right away if you are not doing well or get worse.   This information is not intended to replace advice given to you by your health care provider. Make sure you discuss any questions you have with your health care provider.   Document Released: 05/17/2001 Document Revised: 08/27/2013 Document Reviewed: 11/07/2011 Elsevier Interactive Patient Education [employer]  Please return immediately if condition worsens. Please contact her primary physician or the physician you were given for referral. If you have any specialist physicians involved in her treatment and plan please also contact them. Thank you for using [facility]. Please continue with your normal regimen for your Lasix. The cardiologist will call you on Monday to align outpatient follow-up and treatment. Return here for any chest pain, high  fever, productive cough or any new concerns.

## 2016-07-25 ENCOUNTER — Inpatient Hospital Stay
Admission: EM | Admit: 2016-07-25 | Discharge: 2016-07-27 | DRG: 291 | Disposition: A | Discharge status: Home / Self Care | Payer: Medicare Other | Attending: Specialist | Admitting: Specialist

## 2016-07-25 ENCOUNTER — Emergency Department: Payer: Medicare Other

## 2016-07-25 DIAGNOSIS — Z79899 Other long term (current) drug therapy: Secondary | ICD-10-CM

## 2016-07-25 DIAGNOSIS — Z7982 Long term (current) use of aspirin: Secondary | ICD-10-CM | POA: Diagnosis not present

## 2016-07-25 DIAGNOSIS — Z8249 Family history of ischemic heart disease and other diseases of the circulatory system: Secondary | ICD-10-CM

## 2016-07-25 DIAGNOSIS — K219 Gastro-esophageal reflux disease without esophagitis: Secondary | ICD-10-CM | POA: Diagnosis present

## 2016-07-25 DIAGNOSIS — I493 Ventricular premature depolarization: Secondary | ICD-10-CM | POA: Diagnosis present

## 2016-07-25 DIAGNOSIS — I429 Cardiomyopathy, unspecified: Secondary | ICD-10-CM | POA: Diagnosis present

## 2016-07-25 DIAGNOSIS — I130 Hypertensive heart and chronic kidney disease with heart failure and stage 1 through stage 4 chronic kidney disease, or unspecified chronic kidney disease: Principal | ICD-10-CM | POA: Diagnosis present

## 2016-07-25 DIAGNOSIS — N179 Acute kidney failure, unspecified: Secondary | ICD-10-CM | POA: Diagnosis present

## 2016-07-25 DIAGNOSIS — I248 Other forms of acute ischemic heart disease: Secondary | ICD-10-CM | POA: Diagnosis present

## 2016-07-25 DIAGNOSIS — Z66 Do not resuscitate: Secondary | ICD-10-CM | POA: Diagnosis present

## 2016-07-25 DIAGNOSIS — F419 Anxiety disorder, unspecified: Secondary | ICD-10-CM | POA: Diagnosis present

## 2016-07-25 DIAGNOSIS — I2510 Atherosclerotic heart disease of native coronary artery without angina pectoris: Secondary | ICD-10-CM | POA: Diagnosis present

## 2016-07-25 DIAGNOSIS — N400 Enlarged prostate without lower urinary tract symptoms: Secondary | ICD-10-CM | POA: Diagnosis present

## 2016-07-25 DIAGNOSIS — Z9861 Coronary angioplasty status: Secondary | ICD-10-CM | POA: Diagnosis not present

## 2016-07-25 DIAGNOSIS — E875 Hyperkalemia: Secondary | ICD-10-CM | POA: Diagnosis present

## 2016-07-25 DIAGNOSIS — E785 Hyperlipidemia, unspecified: Secondary | ICD-10-CM | POA: Diagnosis present

## 2016-07-25 DIAGNOSIS — R0602 Shortness of breath: Secondary | ICD-10-CM

## 2016-07-25 DIAGNOSIS — I5023 Acute on chronic systolic (congestive) heart failure: Secondary | ICD-10-CM | POA: Diagnosis present

## 2016-07-25 DIAGNOSIS — N184 Chronic kidney disease, stage 4 (severe): Secondary | ICD-10-CM | POA: Diagnosis present

## 2016-07-25 DIAGNOSIS — Z9049 Acquired absence of other specified parts of digestive tract: Secondary | ICD-10-CM

## 2016-07-25 DIAGNOSIS — Z87891 Personal history of nicotine dependence: Secondary | ICD-10-CM | POA: Diagnosis not present

## 2016-07-25 DIAGNOSIS — R0682 Tachypnea, not elsewhere classified: Secondary | ICD-10-CM | POA: Diagnosis present

## 2016-07-25 DIAGNOSIS — I509 Heart failure, unspecified: Secondary | ICD-10-CM

## 2016-07-25 DIAGNOSIS — R262 Difficulty in walking, not elsewhere classified: Secondary | ICD-10-CM

## 2016-07-25 HISTORY — DX: Hyperlipidemia, unspecified: E78.5

## 2016-07-25 HISTORY — DX: Chronic kidney disease, stage 4 (severe): N18.4

## 2016-07-25 LAB — BASIC METABOLIC PANEL
Anion gap: 13 (ref 5–15)
BUN: 58 mg/dL — AB (ref 6–20)
CALCIUM: 9.3 mg/dL (ref 8.9–10.3)
CHLORIDE: 108 mmol/L (ref 101–111)
CO2: 21 mmol/L — ABNORMAL LOW (ref 22–32)
CREATININE: 2.49 mg/dL — AB (ref 0.61–1.24)
GFR calc non Af Amer: 21 mL/min — ABNORMAL LOW (ref >60)
GFR, EST AFRICAN AMERICAN: 24 mL/min — AB (ref >60)
Glucose, Bld: 193 mg/dL — ABNORMAL HIGH (ref 65–99)
Potassium: 5.5 mmol/L — ABNORMAL HIGH (ref 3.5–5.1)
SODIUM: 142 mmol/L (ref 135–145)

## 2016-07-25 LAB — CBC
HCT: 44.7 % (ref 40.0–52.0)
Hemoglobin: 14.6 g/dL (ref 13.0–18.0)
MCH: 27.5 pg (ref 26.0–34.0)
MCHC: 32.6 g/dL (ref 32.0–36.0)
MCV: 84.3 fL (ref 80.0–100.0)
PLATELETS: 131 10*3/uL — AB (ref 150–440)
RBC: 5.31 MIL/uL (ref 4.40–5.90)
RDW: 17.8 % — AB (ref 11.5–14.5)
WBC: 8.5 10*3/uL (ref 3.8–10.6)

## 2016-07-25 LAB — TROPONIN I
TROPONIN I: 0.14 ng/mL — AB (ref <0.03)
TROPONIN I: 0.4 ng/mL — AB (ref <0.03)
Troponin I: 0.35 ng/mL (ref <0.03)

## 2016-07-25 LAB — BRAIN NATRIURETIC PEPTIDE: B Natriuretic Peptide: 4357 pg/mL — ABNORMAL HIGH (ref 0.0–100.0)

## 2016-07-25 LAB — MAGNESIUM: Magnesium: 2 mg/dL (ref 1.7–2.4)

## 2016-07-25 MED ORDER — FUROSEMIDE 10 MG/ML IJ SOLN
40.0000 mg | Freq: Every day | INTRAMUSCULAR | Status: DC
  Administered 2016-07-26 – 2016-07-27 (×2): 40 mg via INTRAVENOUS
  Filled 2016-07-25 (×2): qty 4

## 2016-07-25 MED ORDER — OCUVITE-LUTEIN PO CAPS
1.0000 | ORAL_CAPSULE | Freq: Two times a day (BID) | ORAL | Status: DC
  Administered 2016-07-25 – 2016-07-27 (×4): 1 via ORAL
  Filled 2016-07-25 (×4): qty 1

## 2016-07-25 MED ORDER — SODIUM CHLORIDE 0.9% FLUSH
3.0000 mL | Freq: Two times a day (BID) | INTRAVENOUS | Status: DC
  Administered 2016-07-25 – 2016-07-27 (×4): 3 mL via INTRAVENOUS

## 2016-07-25 MED ORDER — FUROSEMIDE 10 MG/ML IJ SOLN: 40.0000 mg | Freq: Every day | INTRAMUSCULAR | Status: DC

## 2016-07-25 MED ORDER — HEPARIN SODIUM (PORCINE) 5000 UNIT/ML IJ SOLN
5000.0000 [IU] | Freq: Three times a day (TID) | INTRAMUSCULAR | Status: DC
  Administered 2016-07-25 – 2016-07-27 (×5): 5000 [IU] via SUBCUTANEOUS
  Filled 2016-07-25 (×5): qty 1

## 2016-07-25 MED ORDER — VITAMIN D 1000 UNITS PO TABS
1000.0000 [IU] | ORAL_TABLET | Freq: Every day | ORAL | Status: DC
  Administered 2016-07-25 – 2016-07-26 (×2): 1000 [IU] via ORAL
  Filled 2016-07-25 (×2): qty 1

## 2016-07-25 MED ORDER — SIMVASTATIN 20 MG PO TABS
20.0000 mg | ORAL_TABLET | Freq: Every day | ORAL | Status: DC
  Administered 2016-07-25 – 2016-07-26 (×2): 20 mg via ORAL
  Filled 2016-07-25 (×2): qty 1

## 2016-07-25 MED ORDER — SODIUM POLYSTYRENE SULFONATE 15 GM/60ML PO SUSP
15.0000 g | Freq: Once | ORAL | Status: AC
  Administered 2016-07-25: 15 g via ORAL
  Filled 2016-07-25: qty 60

## 2016-07-25 MED ORDER — FUROSEMIDE 10 MG/ML IJ SOLN
40.0000 mg | Freq: Once | INTRAMUSCULAR | Status: AC
  Administered 2016-07-25: 40 mg via INTRAVENOUS
  Filled 2016-07-25: qty 4

## 2016-07-25 MED ORDER — NITROGLYCERIN 0.4 MG SL SUBL: 0.4000 mg | SUBLINGUAL_TABLET | SUBLINGUAL | Status: DC | PRN

## 2016-07-25 MED ORDER — METOPROLOL SUCCINATE ER 50 MG PO TB24
25.0000 mg | ORAL_TABLET | Freq: Every day | ORAL | Status: DC
Start: 2016-07-26 — End: 2016-07-27
  Administered 2016-07-26 – 2016-07-27 (×2): 25 mg via ORAL
  Filled 2016-07-25 (×2): qty 1

## 2016-07-25 MED ORDER — ONDANSETRON HCL 4 MG/2ML IJ SOLN: 4.0000 mg | Freq: Four times a day (QID) | INTRAMUSCULAR | Status: DC | PRN

## 2016-07-25 MED ORDER — ASPIRIN EC 325 MG PO TBEC
325.0000 mg | DELAYED_RELEASE_TABLET | Freq: Every day | ORAL | Status: DC
  Administered 2016-07-26 – 2016-07-27 (×2): 325 mg via ORAL
  Filled 2016-07-25 (×2): qty 1

## 2016-07-25 MED ORDER — ACETAMINOPHEN 650 MG RE SUPP: 650.0000 mg | Freq: Four times a day (QID) | RECTAL | Status: DC | PRN

## 2016-07-25 MED ORDER — PANTOPRAZOLE SODIUM 40 MG PO TBEC
40.0000 mg | DELAYED_RELEASE_TABLET | Freq: Every day | ORAL | Status: DC
  Administered 2016-07-25 – 2016-07-26 (×2): 40 mg via ORAL
  Filled 2016-07-25 (×2): qty 1

## 2016-07-25 MED ORDER — ASPIRIN 81 MG PO CHEW
324.0000 mg | CHEWABLE_TABLET | Freq: Once | ORAL | Status: AC
  Administered 2016-07-25: 324 mg via ORAL
  Filled 2016-07-25: qty 4

## 2016-07-25 MED ORDER — ONDANSETRON HCL 4 MG PO TABS: 4.0000 mg | ORAL_TABLET | Freq: Four times a day (QID) | ORAL | Status: DC | PRN

## 2016-07-25 MED ORDER — ACETAMINOPHEN 325 MG PO TABS: 650.0000 mg | ORAL_TABLET | Freq: Four times a day (QID) | ORAL | Status: DC | PRN

## 2016-07-25 NOTE — H&P (Signed)
[facility] -  at [facility]   PATIENT NAME: Craig Gallegos    MR#:  [id number]  DATE OF BIRTH:  1920/02/19  DATE OF ADMISSION:  07/25/2016  PRIMARY CARE PHYSICIAN: Lynnea FerrierBERT J KLEIN III, MD   REQUESTING/REFERRING PHYSICIAN: Dr. Jene Everyobert Kinner  CHIEF COMPLAINT:   Chief Complaint  Patient presents with  . Shortness of Breath    HISTORY OF PRESENT ILLNESS:  Craig Gallegos  is a 82 y.o. male with a known history of Congestive heart failure with systolic dysfunction, EF of 15% from 2 weeks ago, CK D stage IV, CAD status post angioplasty long time ago, hypertension and hyperlipidemia presents from home secondary to worsening shortness of breath. Patient states his symptoms started getting worse about a year ago when he was in the hospital. His EF at that time was about 25%. His exercise tolerance has been decreasing greatly in the last year. Over the last week he has been having extensive trouble breathing. He is unable to walk across the room due to dyspnea. He has seen his PCP about 5 days ago and his Lasix was changed from when necessary to daily and dose increased to 80 mg from 40 mg. He has been taking the medication with no significant benefit with his breathing. Echocardiogram done 2 weeks ago for Thedore MinsSingh complains showing EF has dropped down to 15% and significantly weak LV function. Patient denies any history of asthma or COPD. Not on home oxygen. His saturations are greater than 95% even though he is dyspneic and tachypneic. No fevers, nausea or vomiting or any other complaints going on.  PAST MEDICAL HISTORY:   Past Medical History:  Diagnosis Date  . BPH (benign prostatic hyperplasia)   . CHF (congestive heart failure) (HCC)    EF 15% from recent ECHO in Nov 2017  . CKD (chronic kidney disease) stage 4, GFR 15-29 ml/min (HCC)   . Coronary artery disease    angioplasty > 30 years ago  . Hyperlipidemia   . Hypertension     PAST SURGICAL HISTORY:   Past  Surgical History:  Procedure Laterality Date  . APPENDECTOMY    . CORONARY ANGIOPLASTY  1986    SOCIAL HISTORY:   Social History  Substance Use Topics  . Smoking status: Former Smoker    Types: Pipe  . Smokeless tobacco: Never Used  . Alcohol use 1.8 oz/week    3 Glasses of wine per week    FAMILY HISTORY:   Family History  Problem Relation Age of Onset  . Heart failure Father   . Heart failure Brother     DRUG ALLERGIES:   Allergies  Allergen Reactions  . Horse-Derived Products Hives    REVIEW OF SYSTEMS:   Review of Systems  Constitutional: Positive for malaise/fatigue. Negative for chills, fever and weight loss.  HENT: Negative for ear discharge, ear pain, hearing loss, nosebleeds and tinnitus.   Eyes: Negative for blurred vision, double vision and photophobia.  Respiratory: Positive for shortness of breath. Negative for cough, hemoptysis and wheezing.   Cardiovascular: Positive for orthopnea. Negative for chest pain, palpitations and leg swelling.  Gastrointestinal: Negative for abdominal pain, constipation, diarrhea, heartburn, melena, nausea and vomiting.  Genitourinary: Negative for dysuria, frequency, hematuria and urgency.  Musculoskeletal: Negative for back pain, myalgias and neck pain.  Skin: Negative for rash.  Neurological: Negative for dizziness, tingling, tremors, sensory change, speech change, focal weakness and headaches.  Endo/Heme/Allergies: Does not bruise/bleed easily.  Psychiatric/Behavioral: Negative for depression.  MEDICATIONS AT HOME:   Prior to Admission medications   Medication Sig Start Date End Date Taking? Authorizing Provider  aspirin EC 325 MG tablet Take 325 mg by mouth daily.   Yes Historical Provider, MD  cholecalciferol (VITAMIN D) 1000 UNITS tablet Take 1,000 Units by mouth at bedtime.   Yes Historical Provider, MD  furosemide (LASIX) 40 MG tablet Take 40 mg by mouth daily as needed (when pts weight exceeds 169lbs.).   Yes  Historical Provider, MD  losartan (COZAAR) 25 MG tablet Take 25 mg by mouth daily.   Yes Historical Provider, MD  metoprolol succinate (TOPROL-XL) 50 MG 24 hr tablet Take 25 mg by mouth daily.    Yes Historical Provider, MD  Multiple Vitamins-Minerals (PRESERVISION AREDS 2 PO) Take 1 capsule by mouth 2 (two) times daily.    Yes Historical Provider, MD  nitroGLYCERIN (NITROSTAT) 0.4 MG SL tablet Place 0.4 mg under the tongue every 5 (five) minutes as needed for chest pain.    Yes Historical Provider, MD  omeprazole (PRILOSEC) 20 MG capsule Take 20 mg by mouth at bedtime.   Yes Historical Provider, MD  simvastatin (ZOCOR) 20 MG tablet  05/23/16  Yes Historical Provider, MD  acetaminophen (TYLENOL) 325 MG tablet Take 2 tablets (650 mg total) by mouth every 6 (six) hours as needed for mild pain (or Fever >/= 101). Patient not taking: Reported on 10/02/2015 05/26/15   Leotis Shames, MD      VITAL SIGNS:  Blood pressure (!) 101/54, pulse 64, resp. rate (!) 32, height 5\' 8"  (1.727 m), weight 77.1 kg (170 lb), SpO2 96 %.  PHYSICAL EXAMINATION:   Physical Exam  GENERAL:  82 y.o.-year-old patient lying in the bed with no acute distress. Tachypneic, RR of 30 EYES: Pupils equal, round, reactive to light and accommodation. No scleral icterus. Extraocular muscles intact.  HEENT: Head atraumatic, normocephalic. Oropharynx and nasopharynx clear. Hearing aids in place. NECK:  Supple, no jugular venous distention. No thyroid enlargement, no tenderness.  LUNGS: Normal breath sounds bilaterally, no wheezing, rales,rhonchi or crepitation. No use of accessory muscles of respiration. Diminished bibasilar breath sounds CARDIOVASCULAR: S1, S2 normal. No  rubs, or gallops. 3/6 systolic murmur present. ABDOMEN: Soft, nontender, nondistended. Bowel sounds present. No organomegaly or mass.  EXTREMITIES: No pedal edema, cyanosis, or clubbing.  NEUROLOGIC: Cranial nerves II through XII are intact. Muscle strength 5/5 in  all extremities. Sensation intact. Gait not checked.  PSYCHIATRIC: The patient is alert and oriented x 3.  SKIN: No obvious rash, lesion, or ulcer.   LABORATORY PANEL:   CBC  Recent Labs Lab 07/25/16 1013  WBC 8.5  HGB 14.6  HCT 44.7  PLT 131*   ------------------------------------------------------------------------------------------------------------------  Chemistries   Recent Labs Lab 07/25/16 1013  NA 142  K 5.5*  CL 108  CO2 21*  GLUCOSE 193*  BUN 58*  CREATININE 2.49*  CALCIUM 9.3   ------------------------------------------------------------------------------------------------------------------  Cardiac Enzymes  Recent Labs Lab 07/25/16 1013  TROPONINI 0.14*   ------------------------------------------------------------------------------------------------------------------  RADIOLOGY:  Dg Chest 2 View  Result Date: 07/25/2016 CLINICAL DATA:  Shortness of breath for several months, history of smoking EXAM: CHEST  2 VIEW COMPARISON:  11/02/2015 FINDINGS: Heart is again enlarged in size. Tortuosity of the thoracic aorta is again seen. The lungs are well aerated bilaterally. No focal infiltrate or sizable effusion is seen. Mild chronic interstitial changes are noted. No acute bony abnormality is seen. Nipple shadow is noted over the left base better visualized than on the  recent exam. IMPRESSION: No acute abnormality noted. Electronically Signed   By: Alcide CleverMark  Lukens M.D.   On: 07/25/2016 10:43    EKG:   Orders placed or performed during the hospital encounter of 07/25/16  . ED EKG within 10 minutes  . ED EKG within 10 minutes  . EKG 12-Lead  . EKG 12-Lead    IMPRESSION AND PLAN:   Craig Gallegos  is a 82 y.o. male with a known history of Congestive heart failure with systolic dysfunction, EF of 15% from 2 weeks ago, CK D stage IV, CAD status post angioplasty long time ago, hypertension and hyperlipidemia presents from home secondary to worsening  shortness of breath.  #1 acute on chronic dyspnea-worsening shortness of breath. Could be from underlying worsening heart failure. -Acute on chronic systolic CHF exacerbation -Started on IV Lasix at this time. Check BNP -We'll not repeat echocardiogram as well as just done 2 weeks ago. -Cardiology consulted. -Not needing oxygen support. Has several PVCs. Watch for any arrhythmias. Consider AICD  #2 hyperkalemia-hold losartan. Being given Lasix. Ordered a dose of Kayexalate. -Follow up  #3 acute on chronic kidney disease-Baseline creatinine around 2 with GFR of 30. -Could be from over diuresis with Lasix as outpatient. Decrease dose to 40 mg daily at this time - avoid nephrotoxins  #4 Elevated troponin- demand ischemia likely,  Follow troponin trend Denies any chest pain Cardiology f/u On asa, toprol, statin. Hold losartan due to ARF and hyperkalemia  #5 DVT Prophylaxis- SQ heparin  Physical Therapy consulted for weakness.  All the records are reviewed and case discussed with ED provider. Management plans discussed with the patient, family and they are in agreement.  CODE STATUS: Full code  TOTAL TIME TAKING CARE OF THIS PATIENT: 50 minutes.    Enid BaasKALISETTI,Maygan Koeller M.D on 07/25/2016 at 1:08 PM  Between 7am to 6pm - Pager - [phone]  After 6pm go to [email or web address] - Social research officer, governmentpassword [email or web address] [email or web address]  [facility] [place] [facility]  Office  [phone]  CC: Primary care physician; Lynnea FerrierBERT J KLEIN III, MD

## 2016-07-25 NOTE — ED Notes (Signed)
Dr. Kinner notified of critical trop 

## 2016-07-25 NOTE — ED Notes (Signed)
Attempted to call report on pt - was told that the nurse would call me back to get report

## 2016-07-25 NOTE — ED Notes (Signed)
Per admitting doctor do not repeat Lasix 40mg 

## 2016-07-25 NOTE — ED Provider Notes (Signed)
[facility] Emergency Department Provider Note   ____________________________________________    I have reviewed the triage vital signs and the nursing notes.   HISTORY  Chief Complaint Shortness of Breath     HPI Craig Gallegos is a [age] y.o. male who presents with complaints of shortness of breath. Patient reports that he has a history of congestive heart failure and recently he is getting extremely short of breath with any exertion. He denies chest pain. No recent travel. He reports mild lower extremity edema. No fevers or chills. No cough.   Past Medical History:  Diagnosis Date  . BPH (benign prostatic hyperplasia)   . CHF (congestive heart failure) (HCC)   . Coronary artery disease   . Hypertension     Patient Active Problem List   Diagnosis Date Noted  . Bacteremia 07/17/2015  . Chest pain 07/15/2015  . NSTEMI (non-ST elevated myocardial infarction) (HCC) 07/15/2015  . Chronic systolic heart failure (HCC) 06/12/2015  . Essential hypertension 06/12/2015    Past Surgical History:  Procedure Laterality Date  . CORONARY ANGIOPLASTY  1986    Prior to Admission medications   Medication Sig Start Date End Date Taking? Authorizing Provider  aspirin EC 325 MG tablet Take 325 mg by mouth daily.   Yes Historical Provider, MD  cholecalciferol (VITAMIN D) 1000 UNITS tablet Take 1,000 Units by mouth at bedtime.   Yes Historical Provider, MD  furosemide (LASIX) 40 MG tablet Take 40 mg by mouth daily as needed (when pts weight exceeds 169lbs.).   Yes Historical Provider, MD  losartan (COZAAR) 25 MG tablet Take 25 mg by mouth daily.   Yes Historical Provider, MD  metoprolol succinate (TOPROL-XL) 50 MG 24 hr tablet Take 25 mg by mouth daily.    Yes Historical Provider, MD  Multiple Vitamins-Minerals (PRESERVISION AREDS 2 PO) Take 1 capsule by mouth 2 (two) times daily.    Yes Historical Provider, MD  nitroGLYCERIN (NITROSTAT) 0.4 MG SL tablet Place  0.4 mg under the tongue every 5 (five) minutes as needed for chest pain.    Yes Historical Provider, MD  omeprazole (PRILOSEC) 20 MG capsule Take 20 mg by mouth at bedtime.   Yes Historical Provider, MD  simvastatin (ZOCOR) 20 MG tablet  05/23/16  Yes Historical Provider, MD  acetaminophen (TYLENOL) 325 MG tablet Take 2 tablets (650 mg total) by mouth every 6 (six) hours as needed for mild pain (or Fever >/= 101). Patient not taking: Reported on 10/02/2015 05/26/15   Leotis ShamesJasmine Singh, MD     Allergies Horse-derived products  Family History  Problem Relation Age of Onset  . Heart failure Father   . Heart failure Brother     Social History Social History  Substance Use Topics  . Smoking status: Former Smoker    Types: Pipe  . Smokeless tobacco: Never Used  . Alcohol use 1.8 oz/week    3 Glasses of wine per week    Review of Systems  Constitutional: No fever/chills   Cardiovascular: Denies chest pain. Respiratory: As above Gastrointestinal: No abdominal pain.  No nausea, no vomiting.    Musculoskeletal: Negative for back pain. Skin: Negative for rash. Neurological: Negative for headaches or weakness  10-point ROS otherwise negative.  ____________________________________________   PHYSICAL EXAM:  VITAL SIGNS: ED Triage Vitals  Enc Vitals Group     BP 07/25/16 1004 (!) 145/65     Pulse Rate 07/25/16 1004 85     Resp 07/25/16 1004 (!)  24     Temp --      Temp Source 07/25/16 1004 Oral     SpO2 07/25/16 1004 100 %     Weight 07/25/16 1005 170 lb (77.1 kg)     Height 07/25/16 1005 5\' 8"  (1.727 m)     Head Circumference --      Peak Flow --      Pain Score --      Pain Loc --      Pain Edu? --      Excl. in GC? --     Constitutional: Alert and oriented. No acute distress.  Eyes: Conjunctivae are normal.   Nose: No congestion/rhinnorhea. Mouth/Throat: Mucous membranes are moist.    Cardiovascular: Normal rate, regular rhythm. Grossly normal heart sounds.  Good  peripheral circulation. Respiratory:Increased respiratory effort with mild tachypnea.. Lungs CTAB. Gastrointestinal: Soft and nontender. No distention.  No CVA tenderness. Genitourinary: deferred Musculoskeletal: Mild edema bilaterally, no calf pain or tenderness or swelling. Warm and well perfused Neurologic:  Normal speech and language. No gross focal neurologic deficits are appreciated.  Skin:  Skin is warm, dry and intact. No rash noted. Psychiatric: Mood and affect are normal. Speech and behavior are normal.  ____________________________________________   LABS (all labs ordered are listed, but only abnormal results are displayed)  Labs Reviewed  BASIC METABOLIC PANEL - Abnormal; Notable for the following:       Result Value   Potassium 5.5 (*)    CO2 21 (*)    Glucose, Bld 193 (*)    BUN 58 (*)    Creatinine, Ser 2.49 (*)    GFR calc non Af Amer 21 (*)    GFR calc Af Amer 24 (*)    All other components within normal limits  CBC - Abnormal; Notable for the following:    RDW 17.8 (*)    Platelets 131 (*)    All other components within normal limits  TROPONIN I - Abnormal; Notable for the following:    Troponin I 0.14 (*)    All other components within normal limits  BRAIN NATRIURETIC PEPTIDE   ____________________________________________  EKG  ED ECG REPORT I, Jene EveryKINNER, Anica Alcaraz, the attending physician, personally viewed and interpreted this ECG.  Date: 07/25/2016  Rate: 90 Rhythm: normal sinus rhythm QRS Axis: Left axis Intervals:  ST/T Wave abnormalities: normal Conduction Disturbances: Left bundle-branch block Narrative Interpretation: unremarkable  ____________________________________________  RADIOLOGY  Chest x-ray unremarkable ____________________________________________   PROCEDURES  Procedure(s) performed: No    Critical Care performed: no ____________________________________________   INITIAL IMPRESSION / ASSESSMENT AND PLAN / ED  COURSE  Pertinent labs & imaging results that were available during my care of the patient were reviewed by me and considered in my medical decision making (see chart for details).  Patient presents with shortness of breath, no chest pain. He has a history of CHF, recent echocardiogram showed left ventricular function less than 25% with mild aortic stenosis. EKG is unchanged from prior, he does have PVCs as noted his cardiology note. His troponin is elevated, I suspect this is demand ischemia, regardless we will admit to the hospital for further workup. Lasix and aspirin given in the emergency department  Clinical Course    ____________________________________________   FINAL CLINICAL IMPRESSION(S) / ED DIAGNOSES  Final diagnoses:  SOB (shortness of breath)  Acute on chronic systolic congestive heart failure (HCC)      NEW MEDICATIONS STARTED DURING THIS VISIT:  New Prescriptions   No medications  on file     Note:  This document was prepared using Dragon voice recognition software and may include unintentional dictation errors.    Jene Every, MD 07/25/16 1215

## 2016-07-25 NOTE — ED Triage Notes (Signed)
Pt c/o increased SOB for the past 3-4 days .Marland Kitchen. Denies any pain..Marland Kitchen

## 2016-07-25 NOTE — ED Notes (Signed)
Kayexalate not found in ED pyxis - verified with [place] RN

## 2016-07-25 NOTE — ED Notes (Signed)
Pt states SOB for several weeks, states worsening of late, states his PCP has been adjusting his meds and he takes lasix as needed but states he has been taking them everyday, states hx of kidney disease, pt tachypenic upon assessment, worsening with exertion

## 2016-07-26 LAB — CBC
HCT: 40.3 % (ref 40.0–52.0)
Hemoglobin: 13.3 g/dL (ref 13.0–18.0)
MCH: 27.5 pg (ref 26.0–34.0)
MCHC: 33.1 g/dL (ref 32.0–36.0)
MCV: 83 fL (ref 80.0–100.0)
PLATELETS: 94 10*3/uL — AB (ref 150–440)
RBC: 4.86 MIL/uL (ref 4.40–5.90)
RDW: 17.3 % — AB (ref 11.5–14.5)
WBC: 7.2 10*3/uL (ref 3.8–10.6)

## 2016-07-26 LAB — TROPONIN I: Troponin I: 0.38 ng/mL (ref <0.03)

## 2016-07-26 LAB — BASIC METABOLIC PANEL
Anion gap: 10 (ref 5–15)
BUN: 56 mg/dL — AB (ref 6–20)
CALCIUM: 9.1 mg/dL (ref 8.9–10.3)
CO2: 23 mmol/L (ref 22–32)
CREATININE: 2.44 mg/dL — AB (ref 0.61–1.24)
Chloride: 109 mmol/L (ref 101–111)
GFR calc non Af Amer: 21 mL/min — ABNORMAL LOW (ref >60)
GFR, EST AFRICAN AMERICAN: 25 mL/min — AB (ref >60)
GLUCOSE: 112 mg/dL — AB (ref 65–99)
Potassium: 4.4 mmol/L (ref 3.5–5.1)
Sodium: 142 mmol/L (ref 135–145)

## 2016-07-26 LAB — MAGNESIUM: MAGNESIUM: 2 mg/dL (ref 1.7–2.4)

## 2016-07-26 NOTE — Progress Notes (Signed)
Rounded with MD in pts room/ MD made aware of frequent PVS./ pt symptomatic/ will continue to monitor.

## 2016-07-26 NOTE — Progress Notes (Signed)
[employer] [facility] [place] [facility]   PATIENT NAME: Craig Gallegos    MR#:  [id number]  DATE OF BIRTH:  Sep 03, 1920  SUBJECTIVE:   Patient is here due to shortness of breath and noted to be in acute on chronic systolic congestive heart failure. Shortness of breath somewhat improved since yesterday.  REVIEW OF SYSTEMS:    Review of Systems  Constitutional: Negative for chills and fever.  HENT: Negative for congestion and tinnitus.   Eyes: Negative for blurred vision and double vision.  Respiratory: Positive for shortness of breath. Negative for cough and wheezing.   Cardiovascular: Negative for chest pain, orthopnea and PND.  Gastrointestinal: Negative for abdominal pain, diarrhea, nausea and vomiting.  Genitourinary: Negative for dysuria and hematuria.  Neurological: Positive for weakness. Negative for dizziness, sensory change and focal weakness.  All other systems reviewed and are negative.   Nutrition: Heart Healthy Tolerating Diet: Yes Tolerating PT: Eval noted.      DRUG ALLERGIES:   Allergies  Allergen Reactions  . Horse-Derived Products Hives    VITALS:  Blood pressure (!) 115/48, pulse (!) 59, temperature 98 F (36.7 C), temperature source Oral, resp. rate (!) 21, height 5\' 8"  (1.727 m), weight 76 kg (167 lb 8 oz), SpO2 97 %.  PHYSICAL EXAMINATION:   Physical Exam  GENERAL:  82 y.o.-year-old patient lying in the bed in no acute distress.  EYES: Pupils equal, round, reactive to light and accommodation. No scleral icterus. Extraocular muscles intact.  HEENT: Head atraumatic, normocephalic. Oropharynx and nasopharynx clear.  NECK:  Supple, no jugular venous distention. No thyroid enlargement, no tenderness.  LUNGS: Normal breath sounds bilaterally, no wheezing, rales, rhonchi. No use of accessory muscles of respiration.  CARDIOVASCULAR: S1, S2 normal. No murmurs, rubs, or gallops.  ABDOMEN: Soft, nontender, nondistended. Bowel sounds present.  No organomegaly or mass.  EXTREMITIES: No cyanosis, clubbing, Trace edema b/l.    NEUROLOGIC: Cranial nerves II through XII are intact. No focal Motor or sensory deficits b/l.   PSYCHIATRIC: The patient is alert and oriented x 3.  SKIN: No obvious rash, lesion, or ulcer.    LABORATORY PANEL:   CBC  Recent Labs Lab 07/26/16 0218  WBC 7.2  HGB 13.3  HCT 40.3  PLT 94*   ------------------------------------------------------------------------------------------------------------------  Chemistries   Recent Labs Lab 07/26/16 0218  NA 142  K 4.4  CL 109  CO2 23  GLUCOSE 112*  BUN 56*  CREATININE 2.44*  CALCIUM 9.1  MG 2.0   ------------------------------------------------------------------------------------------------------------------  Cardiac Enzymes  Recent Labs Lab 07/26/16 0218  TROPONINI 0.38*   ------------------------------------------------------------------------------------------------------------------  RADIOLOGY:  Dg Chest 2 View  Result Date: 07/25/2016 CLINICAL DATA:  Shortness of breath for several months, history of smoking EXAM: CHEST  2 VIEW COMPARISON:  11/02/2015 FINDINGS: Heart is again enlarged in size. Tortuosity of the thoracic aorta is again seen. The lungs are well aerated bilaterally. No focal infiltrate or sizable effusion is seen. Mild chronic interstitial changes are noted. No acute bony abnormality is seen. Nipple shadow is noted over the left base better visualized than on the recent exam. IMPRESSION: No acute abnormality noted. Electronically Signed   By: Alcide CleverMark  Lukens M.D.   On: 07/25/2016 10:43     ASSESSMENT AND PLAN:   Craig PullingLee Gallegos  is a 82 y.o. male with a known history of Congestive heart failure with systolic dysfunction, EF of 15% from 2 weeks ago, CK D stage IV, CAD status post angioplasty long time ago, hypertension  and hyperlipidemia presents from home secondary to worsening shortness of breath.  #1 acute on chronic  dyspnea-worsening shortness of breath.  Due to decompensated CHF -Acute on chronic systolic CHF exacerbation -Continue diuresis with IV Lasix, follow I's and O's and daily weights. -Await further cardiology input. -Not needing oxygen support. Has several PVCs. Watch for any arrhythmias. Consider AICD but leave to discretion of cardiology.   #2 hyperkalemia-due to CKD.  Improved w/ Kayexylate and will monitor.   #3 acute on chronic kidney disease-Baseline creatinine around 2 with GFR of 30. -Creatinine close to baseline we'll follow with IV diuresis.  #4 Elevated troponin- demand ischemia likely and trop. Did not trend upward.  - Denies any chest pain - await Cards input.  - cont. asa, toprol, statin. Hold losartan due to ARF and hyperkalemia  #5 Hyperlipidemia - cont. Simvastatin.    All the records are reviewed and case discussed with Care Management/Social Worker. Management plans discussed with the patient, family and they are in agreement.  CODE STATUS: Full code  DVT Prophylaxis: Hep. SQ  TOTAL TIME TAKING CARE OF THIS PATIENT: 30 minutes.   POSSIBLE D/C IN 1-2 DAYS, DEPENDING ON CLINICAL CONDITION.   Houston SirenSAINANI,Yolette Hastings J M.D on 07/26/2016 at 2:53 PM  Between 7am to 6pm - Pager - [phone]  After 6pm go to [email or web address] - Social research officer, governmentpassword EPAS [facility]  [employer] [place] [facility]  Office  [phone]  CC: Primary care physician; Lynnea FerrierBERT J KLEIN III, MD

## 2016-07-26 NOTE — Progress Notes (Signed)
HF Clinic appointment scheduled for August 12, 2016 at 11:30am. Patient hasn't been seen in the clinic since November 2016.

## 2016-07-26 NOTE — Care Management (Signed)
Patient is a resident of [facility] - independent living level of care.  Admitted with congestive heart failure that failed outpatient attempts as management.  Patient has an EF of 15 - 20%.   Independent in all adls, denies issues accessing medical care, obtaining medications or with transportation.  Says he currently is able to drive.  Seems knowledgeable of his medical condition. Current with  PCP.  No discharge needs identified at present by care manager or members of care team.

## 2016-07-26 NOTE — Evaluation (Signed)
Physical Therapy Evaluation Patient Details Name: Craig Gallegos MRN: [id number] DOB: [birth year] Today's Date: 07/26/2016   History of Present Illness  Pt is a 82 y/o M who presented with SOB.  Admitting dx: CHF exacerbation. Pt's PMH includes CHF, CKD, coronary angioplasty.       Clinical Impression  Pt admitted with above diagnosis. Pt currently with functional limitations due to the deficits listed below (see PT Problem List). Mr. Craig FusSchuchardt is from an Independent Living facility where he was independent without h/o falls in the past 6 months.  However, pt scored 42/56 on the [facility] Balance Test, indicating pt is at an increased risk of falling.  SpO2 remains at or above 95% on RA throughout session. Pt will benefit from skilled PT to increase their independence and safety with mobility to allow discharge to the venue listed below.      Follow Up Recommendations Home health PT    Equipment Recommendations  None recommended by PT    Recommendations for Other Services       Precautions / Restrictions Precautions Precautions: Fall;Other (comment) Precaution Comments: monitor O2, not on O2 at baseline Restrictions Weight Bearing Restrictions: No      Mobility  Bed Mobility Overal bed mobility: Modified Independent             General bed mobility comments: Increased time but no physical assist or cues needed.  Transfers Overall transfer level: Modified independent Equipment used: None             General transfer comment: Slow to stand and sit but no unsteadiness noted and no physical assist or cues needed.  Ambulation/Gait Ambulation/Gait assistance: Supervision Ambulation Distance (Feet): 200 Feet Assistive device: None Gait Pattern/deviations: Step-through pattern;Decreased stride length Gait velocity: decreased Gait velocity interpretation: Below normal speed for age/gender General Gait Details: DOE 2/4 which improves with cues for pursed lip breathing.   SpO2 100% while ambulating on RA.    Stairs            Wheelchair Mobility    Modified Rankin (Stroke Patients Only)       Balance Overall balance assessment: Needs assistance Sitting-balance support: No upper extremity supported;Feet supported Sitting balance-Leahy Scale: Normal     Standing balance support: No upper extremity supported;During functional activity Standing balance-Leahy Scale: Fair               High level balance activites: Sudden stops;Turns;Head turns;Other (comment) (increased gait speed) High Level Balance Comments: No instability with high level balance activities but slows his gait with head turns. Standardized Balance Assessment Standardized Balance Assessment : Berg Balance Test Berg Balance Test Sit to Stand: Able to stand without using hands and stabilize independently Standing Unsupported: Able to stand safely 2 minutes Sitting with Back Unsupported but Feet Supported on Floor or Stool: Able to sit safely and securely 2 minutes Stand to Sit: Sits safely with minimal use of hands Transfers: Able to transfer safely, minor use of hands Standing Unsupported with Eyes Closed: Able to stand 10 seconds safely Standing Ubsupported with Feet Together: Able to place feet together independently and stand 1 minute safely From Standing, Reach Forward with Outstretched Arm: Can reach forward >5 cm safely (2") From Standing Position, Pick up Object from Floor: Able to pick up shoe, needs supervision From Standing Position, Turn to Look Behind Over each Shoulder: Turn sideways only but maintains balance Turn 360 Degrees: Able to turn 360 degrees safely but slowly Standing Unsupported, Alternately Place Feet on  Step/Stool: Able to complete >2 steps/needs minimal assist Standing Unsupported, One Foot in Front: Able to plae foot ahead of the other independently and hold 30 seconds Standing on One Leg: Tries to lift leg/unable to hold 3 seconds but remains  standing independently Total Score: 42         Pertinent Vitals/Pain Pain Assessment: No/denies pain    Home Living Family/patient expects to be discharged to:: Private residence Living Arrangements: Alone Available Help at Discharge: Friend(s);Available PRN/intermittently Type of Home: Independent living facility [facility] Home Access: Level entry     Home Layout: One level Home Equipment: Cane - single point;Grab bars - tub/shower      Prior Function Level of Independence: Independent         Comments: Pt not using AD at baseline.  Denies any falls over the past 6 months.  Does his own cooking, cleaning, bathing independently.     Hand Dominance        Extremity/Trunk Assessment   Upper Extremity Assessment: Overall WFL for tasks assessed           Lower Extremity Assessment: LLE deficits/detail   LLE Deficits / Details: L knee flexion strength 4/5  Cervical / Trunk Assessment: Normal  Communication   Communication: No difficulties  Cognition Arousal/Alertness: Awake/alert Behavior During Therapy: WFL for tasks assessed/performed Overall Cognitive Status: Within Functional Limits for tasks assessed                      General Comments General comments (skin integrity, edema, etc.): Pt scored 42/56 on the Berg Balance Test, indicating pt is an increased risk of falling.  SpO2 remains at or above 95% on RA throughout session.    Exercises General Exercises - Upper Extremity Shoulder Flexion: AROM;Both;10 reps;Seated General Exercises - Lower Extremity Long Arc Quad: AROM;Both;10 reps;Seated   Assessment/Plan    PT Assessment Patient needs continued PT services  PT Problem List Decreased strength;Decreased balance;Decreased safety awareness;Cardiopulmonary status limiting activity          PT Treatment Interventions      PT Goals (Current goals can be found in the Care Plan section)  Acute Rehab PT Goals Patient Stated Goal: to go  home PT Goal Formulation: With patient Time For Goal Achievement: 08/09/16 Potential to Achieve Goals: Good    Frequency Min 2X/week   Barriers to discharge        Co-evaluation               End of Session Equipment Utilized During Treatment: Gait belt Activity Tolerance: Patient tolerated treatment well Patient left: in chair;with call bell/phone within reach;with chair alarm set;with family/visitor present Nurse Communication: Mobility status;Other (comment) (SpO2)         Time: [id number] PT Time Calculation (min) (ACUTE ONLY): 20 min   Charges:   PT Evaluation $PT Eval Low Complexity: 1 Procedure     PT G Codes:        Encarnacion ChuAshley Abashian PT, DPT 07/26/2016, 12:43 PM

## 2016-07-26 NOTE — Progress Notes (Signed)
Chaplain entered the room while patient was in a deep sleep. Chaplain provided a ministry of presence and prayer to patient.

## 2016-07-26 NOTE — Discharge Instructions (Signed)
Heart Failure Clinic appointment on August 12, 2016 at 11:30am with Craig Kindredina Kalib Bhagat, FNP. Please call [phone] to reschedule.

## 2016-07-27 LAB — BASIC METABOLIC PANEL
Anion gap: 13 (ref 5–15)
BUN: 55 mg/dL — AB (ref 6–20)
CALCIUM: 9.3 mg/dL (ref 8.9–10.3)
CO2: 22 mmol/L (ref 22–32)
CREATININE: 2.31 mg/dL — AB (ref 0.61–1.24)
Chloride: 106 mmol/L (ref 101–111)
GFR calc Af Amer: 26 mL/min — ABNORMAL LOW (ref >60)
GFR, EST NON AFRICAN AMERICAN: 23 mL/min — AB (ref >60)
GLUCOSE: 134 mg/dL — AB (ref 65–99)
Potassium: 4.5 mmol/L (ref 3.5–5.1)
Sodium: 141 mmol/L (ref 135–145)

## 2016-07-27 NOTE — Care Management Important Message (Signed)
Important Message  Patient Details  Name: Orville GovernLee F Holstein MRN: [id number] Date of Birth: 01/14/1920   Medicare Important Message Given:  Yes    Marily MemosLisa M Glenna Brunkow, RN 07/27/2016, 10:53 AM

## 2016-07-27 NOTE — Care Management Note (Signed)
Case Management Note  Patient Details  Name: Craig Gallegos MRN: [id number] Date of Birth: 07-Dec-1919  Subjective/Objective:    Spoke with patient and his neighbor. They are agreeable to home health SN and PT. No agency preference. [facility]. Ordered a Rol[facility] form [facility]. Patient agrees with POC.                 Action/Plan:   Expected Discharge Date:     11/122/2017             Expected Discharge Plan:  Home w Home Health Services  In-House Referral:     Discharge planning Services  CM Consult  Post Acute Care Choice:  Home Health Choice offered to:  Patient  DME Arranged:  Walker rolling with seat DME Agency:  [facility]  HH Arranged:  RN, PT [facility] Agency:  [facility]  Status of Service:  Completed, signed off  If discussed at Long Length of Stay Meetings, dates discussed:    Additional Comments:  Craig MemosLisa M Londyn Wotton, RN 07/27/2016, 10:16 AM

## 2016-07-27 NOTE — Progress Notes (Signed)
Subjective:  Pt states to be doing better with improved sob  Objective:  Vital Signs in the last 24 hours: Temp:  [97.5 F (36.4 C)-98 F (36.7 C)] 97.5 F (36.4 C) (11/22 0350) Pulse Rate:  [43-116] 116 (11/22 0350) Resp:  [18-21] 18 (11/22 0350) BP: (100-117)/(44-80) 100/44 (11/22 0350) SpO2:  [91 %-98 %] 92 % (11/22 0350) Weight:  [75.2 kg (165 lb 11.2 oz)] 75.2 kg (165 lb 11.2 oz) (11/22 0500)  Intake/Output from previous day: 11/21 0701 - 11/22 0700 In: 240 [P.O.:240] Out: 600 [Urine:600] Intake/Output from this shift: Total I/O In: -  Out: 100 [Urine:100]  Physical Exam: General appearance: appears stated age Neck: no adenopathy, no carotid bruit, no JVD, supple, symmetrical, trachea midline and thyroid not enlarged, symmetric, no tenderness/mass/nodules Lungs: diminished breath sounds bibasilar Heart: regular rate and rhythm, S1, S2 normal, no murmur, click, rub or gallop Abdomen: soft, non-tender; bowel sounds normal; no masses,  no organomegaly Extremities: extremities normal, atraumatic, no cyanosis or edema Pulses: 2+ and symmetric Skin: Skin color, texture, turgor normal. No rashes or lesions Neurologic: Alert and oriented X 3, normal strength and tone. Normal symmetric reflexes. Normal coordination and gait  Lab Results:  Recent Labs  07/25/16 1013 07/26/16 0218  WBC 8.5 7.2  HGB 14.6 13.3  PLT 131* 94*    Recent Labs  07/26/16 0218 07/27/16 0354  NA 142 141  K 4.4 4.5  CL 109 106  CO2 23 22  GLUCOSE 112* 134*  BUN 56* 55*  CREATININE 2.44* 2.31*    Recent Labs  07/25/16 2010 07/26/16 0218  TROPONINI 0.40* 0.38*   Hepatic Function Panel No results for input(s): PROT, ALBUMIN, AST, ALT, ALKPHOS, BILITOT, BILIDIR, IBILI in the last 72 hours. No results for input(s): CHOL in the last 72 hours. No results for input(s): PROTIME in the last 72 hours.  Imaging: Imaging results have been reviewed  Cardiac Studies:  Assessment/Plan:   Cardiomyopathy CHF Coronary Artery Disease Edema Shortness of Breath  GERD Hyperlipidemia Elevated troponins . PLAN Agree with tele Continue 02 as needed Troponins probably demand ischemia Agree with Lasix IV Continue b-blocker consider ACE/ARB Continue Statin therapy DVT prophylaxis Continue ASA Defer cardiac cath for now Increase activity  LOS: 2 days    Madalynne Gutmann D Layden Caterino 07/27/2016, 6:55 AM

## 2016-07-27 NOTE — Progress Notes (Signed)
Patient's heart rate increases to the 140's with exertion but returns to 110's at rest. Patient experiences rapid , labored breathing periodically while sleeping. Awaken patient to slow down breathing. Patient explained that this occurs at home. Patient remains on room air, pulse ox is stable.

## 2016-07-27 NOTE — Progress Notes (Signed)
Discharge instructions explained to pt/ verbalized an understanding / iv and tele removed/ transported off unit via wheelchair.  

## 2016-07-28 NOTE — Discharge Summary (Signed)
[facility] [place] at [facility]   PATIENT NAME: Craig Gallegos    MR#:  [id number]  DATE OF BIRTH:  [birth year]  DATE OF ADMISSION:  07/25/2016 ADMITTING PHYSICIAN: Enid Baasadhika Kalisetti, MD  DATE OF DISCHARGE: 07/27/2016 11:55 AM  PRIMARY CARE PHYSICIAN: Curtis SitesBERT J KLEIN III, MD    ADMISSION DIAGNOSIS:  SOB (shortness of breath) [R06.02] Acute on chronic systolic congestive heart failure (HCC) [I50.23]  DISCHARGE DIAGNOSIS:  Active Problems:   CHF exacerbation (HCC)   SECONDARY DIAGNOSIS:   Past Medical History:  Diagnosis Date  . BPH (benign prostatic hyperplasia)   . CHF (congestive heart failure) (HCC)    EF 15% from recent ECHO in Nov 2017  . CKD (chronic kidney disease) stage 4, GFR 15-29 ml/min (HCC)   . Coronary artery disease    angioplasty > 30 years ago  . Hyperlipidemia   . Hypertension     HOSPITAL COURSE:   Craig Gallegos a 82 y.o. malewith a known history of Congestive heart failure with systolic dysfunction, EF of 15% from 2 weeks ago, CK D stage IV, CAD status post angioplasty long time ago, hypertension and hyperlipidemia presents from home secondary to worsening shortness of breath.  #1 acute on chronic dyspnea- Due to decompensated CHF with underlying anxiety.  -Patient was diuresed with IV Lasix but continued to have some symptomatic shortness of breath. He was although not hypoxic. This is probably his new baseline which was explained to the patient. -He did not qualify for home oxygen. He is being discharged home with close follow-up with his cardiologist and primary care physician as outpatient.  #2 hyperkalemia-due to CKD.  This improved and resolved w/ Kayexylate.    #3 acute on chronic kidney disease-Baseline creatinine around 2 with GFR of 30. -His creatinine remained close to baseline with IV diuresis can be further followed as an outpatient.  #4 Elevated troponin-still supply demand ischemia and poor renal clearance.  There was no evidence of acute coronary syndrome. Patient had no evidence of acute chest pain. - cont. asa, toprol, statin.   #5 Hyperlipidemia - cont. Simvastatin  #6 GERD - pt. Will cont. Omeprazole.   DISCHARGE CONDITIONS:   Stable.   CONSULTS OBTAINED:  Treatment Team:  Alwyn Peawayne D Callwood, MD  DRUG ALLERGIES:   Allergies  Allergen Reactions  . Horse-Derived Products Hives    DISCHARGE MEDICATIONS:     Medication List    TAKE these medications   acetaminophen 325 MG tablet Commonly known as:  TYLENOL Take 2 tablets (650 mg total) by mouth every 6 (six) hours as needed for mild pain (or Fever >/= 101).   aspirin EC 325 MG tablet Take 325 mg by mouth daily.   cholecalciferol 1000 units tablet Commonly known as:  VITAMIN D Take 1,000 Units by mouth at bedtime.   furosemide 40 MG tablet Commonly known as:  LASIX Take 40 mg by mouth daily as needed (when pts weight exceeds 169lbs.).   losartan 25 MG tablet Commonly known as:  COZAAR Take 25 mg by mouth daily.   metoprolol succinate 50 MG 24 hr tablet Commonly known as:  TOPROL-XL Take 25 mg by mouth daily.   nitroGLYCERIN 0.4 MG SL tablet Commonly known as:  NITROSTAT Place 0.4 mg under the tongue every 5 (five) minutes as needed for chest pain.   omeprazole 20 MG capsule Commonly known as:  PRILOSEC Take 20 mg by mouth at bedtime.   PRESERVISION AREDS 2 PO Take 1 capsule by  mouth 2 (two) times daily.   simvastatin 20 MG tablet Commonly known as:  ZOCOR         DISCHARGE INSTRUCTIONS:   DIET:  Cardiac diet  DISCHARGE CONDITION:  Stable  ACTIVITY:  Activity as tolerated  OXYGEN:  Home Oxygen: No.   Oxygen Delivery: room air  DISCHARGE LOCATION:  home   If you experience worsening of your admission symptoms, develop shortness of breath, life threatening emergency, suicidal or homicidal thoughts you must seek medical attention immediately by calling 911 or calling your MD immediately   if symptoms less severe.  You Must read complete instructions/literature along with all the possible adverse reactions/side effects for all the Medicines you take and that have been prescribed to you. Take any new Medicines after you have completely understood and accpet all the possible adverse reactions/side effects.   Please note  You were cared for by a hospitalist during your hospital stay. If you have any questions about your discharge medications or the care you received while you were in the hospital after you are discharged, you can call the unit and asked to speak with the hospitalist on call if the hospitalist that took care of you is not available. Once you are discharged, your primary care physician will handle any further medical issues. Please note that NO REFILLS for any discharge medications will be authorized once you are discharged, as it is imperative that you return to your primary care physician (or establish a relationship with a primary care physician if you do not have one) for your aftercare needs so that they can reassess your need for medications and monitor your lab values.     Today   Still complaining of some shortness of breath but not hypoxic.  No other acute events overnight.    VITAL SIGNS:  Blood pressure (!) 97/47, pulse 99, temperature 97.6 F (36.4 C), temperature source Oral, resp. rate 18, height 5\' 8"  (1.727 m), weight 75.2 kg (165 lb 11.2 oz), SpO2 99 %.  I/O:  No intake or output data in the 24 hours ending 07/28/16 1421  PHYSICAL EXAMINATION:  GENERAL:  82 y.o.-year-old patient lying in the bed with no acute distress.  EYES: Pupils equal, round, reactive to light and accommodation. No scleral icterus. Extraocular muscles intact.  HEENT: Head atraumatic, normocephalic. Oropharynx and nasopharynx clear.  NECK:  Supple, no jugular venous distention. No thyroid enlargement, no tenderness.  LUNGS: Normal breath sounds bilaterally, no wheezing,  rales,rhonchi. No use of accessory muscles of respiration.  CARDIOVASCULAR: S1, S2 normal. No murmurs, rubs, or gallops.  ABDOMEN: Soft, non-tender, non-distended. Bowel sounds present. No organomegaly or mass.  EXTREMITIES: Trace edema b/l, No cyanosis, or clubbing.  NEUROLOGIC: Cranial nerves II through XII are intact. No focal motor or sensory defecits b/l.  PSYCHIATRIC: The patient is alert and oriented x 3. Good affect.  SKIN: No obvious rash, lesion, or ulcer.   DATA REVIEW:   CBC  Recent Labs Lab 07/26/16 0218  WBC 7.2  HGB 13.3  HCT 40.3  PLT 94*    Chemistries   Recent Labs Lab 07/26/16 0218 07/27/16 0354  NA 142 141  K 4.4 4.5  CL 109 106  CO2 23 22  GLUCOSE 112* 134*  BUN 56* 55*  CREATININE 2.44* 2.31*  CALCIUM 9.1 9.3  MG 2.0  --     Cardiac Enzymes  Recent Labs Lab 07/26/16 0218  TROPONINI 0.38*    RADIOLOGY:  No results found.  Management plans discussed with the patient, family and they are in agreement.  CODE STATUS:  Code Status History    Date Active Date Inactive Code Status Order ID Comments User Context   07/25/2016  2:06 PM 07/27/2016  3:16 PM Full Code [id number]  Enid Baasadhika Kalisetti, MD ED   07/15/2015  9:08 AM 07/17/2015  4:08 PM Full Code [id number]  Shaune PollackQing Chen, MD Inpatient   07/15/2015  4:52 AM 07/15/2015  4:52 AM Full Code [id number]  Arnaldo NatalMichael S Diamond, MD ED   07/15/2015  4:51 AM 07/15/2015  4:52 AM DNR [id number]  Arnaldo NatalMichael S Diamond, MD ED   05/25/2015 11:15 AM 05/26/2015  5:53 PM Full Code [id number]  Katha HammingSnehalatha Konidena, MD ED      TOTAL TIME TAKING CARE OF THIS PATIENT: 40 minutes.    Houston SirenSAINANI,VIVEK J M.D on 07/28/2016 at 2:21 PM  Between 7am to 6pm - Pager - [phone]  After 6pm go to [email or web address] - Social research officer, governmentpassword [email or web address]  [employer] [place] [facility]  Office  [phone]  CC: Primary care physician; Lynnea FerrierBERT J KLEIN III, MD

## 2016-08-04 NOTE — Consult Note (Signed)
Reason for Consult: Shortness of breath congestive heart failure Referring Physician: Dr. Christie NottinghamBret Klein  Primary, Hospitalist Dr Nemiah CommanderKalisetti Cardiologist Dr. Mikeal HawthorneKowalski  Craig Pieter PartridgeF Gallegos is an 82 y.o. male.  HPI: Presents with known congestive heart failure systolic dysfunction ejection fraction less than 25% chronic renal insufficiency stage IV coronary disease history of PCI and stent in the distant past hypertension hyperlipidemia presented with progressive worsening shortness of breath. Denies any chest pain but has gotten more and more dyspneic in the last few weeks he got progressively worse and he came to emergency room for evaluation. Denies any history of COPD quit smoking years ago denies any blackout spells or syncope states to be compliant with his medication  Past Medical History:  Diagnosis Date  . BPH (benign prostatic hyperplasia)   . CHF (congestive heart failure) (HCC)    EF 15% from recent ECHO in Nov 2017  . CKD (chronic kidney disease) stage 4, GFR 15-29 ml/min (HCC)   . Coronary artery disease    angioplasty > 30 years ago  . Hyperlipidemia   . Hypertension     Past Surgical History:  Procedure Laterality Date  . APPENDECTOMY    . CORONARY ANGIOPLASTY  1986    Family History  Problem Relation Age of Onset  . Heart failure Father   . Heart failure Brother     Social History:  reports that he has quit smoking. His smoking use included Pipe. He has never used smokeless tobacco. He reports that he drinks about 1.8 oz of alcohol per week . He reports that he does not use drugs.  Allergies:  Allergies  Allergen Reactions  . Horse-Derived Products Hives    Medications: I have reviewed the patient's current medications.  No results found for this or any previous visit (from the past 48 hour(s)).  No results found.  Review of Systems  Constitutional: Positive for malaise/fatigue.  HENT: Positive for congestion.   Eyes: Negative.   Respiratory: Positive for  shortness of breath.   Cardiovascular: Positive for palpitations, orthopnea, leg swelling and PND.  Gastrointestinal: Negative.   Genitourinary: Negative.   Musculoskeletal: Negative.   Skin: Negative.   Neurological: Positive for weakness.  Endo/Heme/Allergies: Negative.   Psychiatric/Behavioral: Negative.    Blood pressure (!) 97/47, pulse 99, temperature 97.6 F (36.4 C), temperature source Oral, resp. rate 18, height 5\' 8"  (1.727 m), weight 75.2 kg (165 lb 11.2 oz), SpO2 99 %. Physical Exam  Nursing note and vitals reviewed. Constitutional: He is oriented to person, place, and time. He appears well-developed and well-nourished.  HENT:  Head: Normocephalic and atraumatic.  Eyes: Conjunctivae and EOM are normal. Pupils are equal, round, and reactive to light.  Neck: Normal range of motion. Neck supple.  Cardiovascular: Normal rate and regular rhythm.  Exam reveals gallop.   Murmur heard. Respiratory: He has decreased breath sounds in the right upper field, the right middle field, the right lower field, the left upper field, the left middle field and the left lower field. He has rhonchi in the right upper field, the right middle field, the right lower field, the left upper field, the left middle field and the left lower field. He has rales in the right upper field, the right middle field, the right lower field, the left upper field, the left middle field and the left lower field.  GI: Soft. Bowel sounds are normal.  Musculoskeletal: Normal range of motion.  Neurological: He is alert and oriented to person, place, and time.  He has normal reflexes.  Skin: Skin is warm and dry.  Psychiatric: He has a normal mood and affect.    Assessment/Plan: SOB CHF systolic function EF less than 25% CM systolic dysfunction HTN Non Stemi CP Former smoker Hyperlipidemia . PLAN Continue tele Agree with diuretics Continue HTN control Cardiomyopathy systolic dysfunction severe continue  therapy Habits patient follow-up with nephrology for chronic renal insufficiency Continue hyperlipidemia control with simvastatin Agree with omeprazole for GERD symptoms Consider discussion about defibrillator, age may be a factor as a deterrent    Craig Gallegos 08/04/2016, 2:07 PM

## 2016-08-12 ENCOUNTER — Ambulatory Visit: Payer: Medicare Other | Attending: Family | Admitting: Family

## 2016-08-12 ENCOUNTER — Encounter: Payer: Self-pay | Admitting: Family

## 2016-08-12 VITALS — BP 107/49 | HR 93 | Resp 18 | Ht 68.0 in | Wt 176.0 lb

## 2016-08-12 DIAGNOSIS — I130 Hypertensive heart and chronic kidney disease with heart failure and stage 1 through stage 4 chronic kidney disease, or unspecified chronic kidney disease: Secondary | ICD-10-CM | POA: Diagnosis not present

## 2016-08-12 DIAGNOSIS — Z7982 Long term (current) use of aspirin: Secondary | ICD-10-CM | POA: Diagnosis not present

## 2016-08-12 DIAGNOSIS — I5022 Chronic systolic (congestive) heart failure: Secondary | ICD-10-CM | POA: Diagnosis not present

## 2016-08-12 DIAGNOSIS — I10 Essential (primary) hypertension: Secondary | ICD-10-CM

## 2016-08-12 DIAGNOSIS — Z87891 Personal history of nicotine dependence: Secondary | ICD-10-CM | POA: Diagnosis not present

## 2016-08-12 DIAGNOSIS — Z79899 Other long term (current) drug therapy: Secondary | ICD-10-CM | POA: Diagnosis not present

## 2016-08-12 DIAGNOSIS — M7989 Other specified soft tissue disorders: Secondary | ICD-10-CM | POA: Insufficient documentation

## 2016-08-12 DIAGNOSIS — Z5189 Encounter for other specified aftercare: Secondary | ICD-10-CM | POA: Insufficient documentation

## 2016-08-12 NOTE — Progress Notes (Signed)
Patient ID: Craig Gallegos, male    DOB: May 23, 1920, 82 y.o.   MRN: [id number]  HPI  Mr Maisie FusSchuchardt is a 82 y/o male with a history of HTN, hyperlipidemia, CAD (angioplasty), CKD, BPH, remote tobacco use and chronic heart failure.   Last echo was done 07/11/16 and showed an EF of 15% with mild AS, moderate AR/MR/TR. EF has dropped from 25% on 10/16/15. PFT's were done 01/21/16. Stress test done 08/12/15 which showed severely reduced left ventricular function, global hypokinesis and moderate inferior and septal wall scar.   Last admitted on 07/25/16 with HF exacerbation. Was treated with IV diuretics. Was given kayexylate which resolved his hyperkalemia. Elevated troponin thought to be due to demand ischemia. Was discharged home after 2 days. Prior ED visit was 10/02/15 for dyspnea. Was treated and released.   He presents today for a follow-up visit with fatigue and shortness of breath with little exertion. Chronic swelling in his lower extremities. Continues to weigh daily and says that his weight has been stable. Due to see his PCP next week.   Past Medical History:  Diagnosis Date  . BPH (benign prostatic hyperplasia)   . CHF (congestive heart failure) (HCC)    EF 15% from recent ECHO in Nov 2017  . CKD (chronic kidney disease) stage 4, GFR 15-29 ml/min (HCC)   . Coronary artery disease    angioplasty > 30 years ago  . Hyperlipidemia   . Hypertension     Past Surgical History:  Procedure Laterality Date  . APPENDECTOMY    . CORONARY ANGIOPLASTY  1986    Family History  Problem Relation Age of Onset  . Heart failure Father   . Heart failure Brother     Social History  Substance Use Topics  . Smoking status: Former Smoker    Types: Pipe  . Smokeless tobacco: Never Used  . Alcohol use 1.8 oz/week    3 Glasses of wine per week    Allergies  Allergen Reactions  . Horse-Derived Products Hives    Prior to Admission medications   Medication Sig Start Date End Date Taking?  Authorizing Provider  acetaminophen (TYLENOL) 325 MG tablet Take 2 tablets (650 mg total) by mouth every 6 (six) hours as needed for mild pain (or Fever >/= 101). 05/26/15  Yes Leotis ShamesJasmine Singh, MD  aspirin EC 325 MG tablet Take 325 mg by mouth daily.   Yes Historical Provider, MD  cholecalciferol (VITAMIN D) 1000 UNITS tablet Take 1,000 Units by mouth at bedtime.   Yes Historical Provider, MD  furosemide (LASIX) 20 MG tablet Take 20 mg by mouth daily.   Yes Historical Provider, MD  isosorbide mononitrate (IMDUR) 30 MG 24 hr tablet Take 30 mg by mouth daily.   Yes Historical Provider, MD  losartan (COZAAR) 25 MG tablet Take 25 mg by mouth daily.   Yes Historical Provider, MD  metoprolol succinate (TOPROL-XL) 25 MG 24 hr tablet Take 25 mg by mouth daily.   Yes Historical Provider, MD  Multiple Vitamins-Minerals (PRESERVISION AREDS 2 PO) Take 1 capsule by mouth 2 (two) times daily.    Yes Historical Provider, MD  nitroGLYCERIN (NITROSTAT) 0.4 MG SL tablet Place 0.4 mg under the tongue every 5 (five) minutes as needed for chest pain.    Yes Historical Provider, MD  omeprazole (PRILOSEC) 20 MG capsule Take 20 mg by mouth at bedtime.   Yes Historical Provider, MD  simvastatin (ZOCOR) 20 MG tablet  05/23/16  Yes Historical Provider, MD  Review of Systems  Constitutional: Positive for fatigue. Negative for appetite change.  HENT: Positive for hearing loss. Negative for rhinorrhea and sore throat.   Eyes: Negative.   Respiratory: Positive for shortness of breath. Negative for cough, chest tightness and wheezing.   Cardiovascular: Positive for leg swelling. Negative for chest pain and palpitations.  Gastrointestinal: Negative for abdominal distention and abdominal pain.  Endocrine: Negative.   Genitourinary: Negative.   Musculoskeletal: Negative for back pain and neck pain.  Skin: Negative.   Allergic/Immunologic: Negative.   Neurological: Negative for dizziness and light-headedness.  Hematological:  Negative for adenopathy. Does not bruise/bleed easily.  Psychiatric/Behavioral: Negative for dysphoric mood and sleep disturbance (sleeping on 1 pillow). The patient is not nervous/anxious.    Vitals:   08/12/16 1201  BP: (!) 107/49  Pulse: 93  Resp: 18  SpO2: 97%  Weight: 176 lb (79.8 kg)  Height: 5\' 8"  (1.727 m)   Wt Readings from Last 3 Encounters:  08/12/16 176 lb (79.8 kg)  07/27/16 165 lb 11.2 oz (75.2 kg)  10/02/15 169 lb (76.7 kg)   Lab Results  Component Value Date   CREATININE 2.31 (H) 07/27/2016   CREATININE 2.44 (H) 07/26/2016   CREATININE 2.49 (H) 07/25/2016    Physical Exam  Constitutional: He is oriented to person, place, and time. He appears well-developed and well-nourished.  HENT:  Head: Normocephalic and atraumatic.  Right Ear: Decreased hearing is noted.  Left Ear: Decreased hearing is noted.  Eyes: Conjunctivae are normal. Pupils are equal, round, and reactive to light.  Neck: Normal range of motion. Neck supple. No JVD present.  Cardiovascular: Normal rate and regular rhythm.   Pulmonary/Chest: Effort normal. He has no wheezes. He has no rales.  Abdominal: Soft. He exhibits no distension. There is no tenderness.  Musculoskeletal: He exhibits edema (2+ pitting edema in bilateral lower legs). He exhibits no tenderness.  Neurological: He is alert and oriented to person, place, and time.  Skin: Skin is warm and dry.  Psychiatric: He has a normal mood and affect. His behavior is normal. Thought content normal.  Vitals reviewed.  Assessment & Plan:  1: Chronic heart failure with reduced ejection fraction- - NYHA class III - mildly fluid overloaded (has not taken his diuretic yet today) - already weighing daily. Reminded to call for an overnight weight gain of >2 pounds or a weekly weight gain of >5 pounds - not adding salt to his food  - encouraged him to elevate his legs when at home - saw cardiologist Gwen Pounds(Kowalski) on 08/08/16 and returns on 12/12/16 -  received flu and pneumonia vaccines for this season  2: HTN- - BP looks good today - continue medications at this time - sees PCP Graciela Husbands(Klein) on 08/18/16 - patient thinks he will be getting lab work done at that time  Patient is not interested in making any medication changes at this time and prefers to not make a follow-up appointment. Advised patient that he could call back at any time to make another appointment.

## 2016-08-12 NOTE — Patient Instructions (Signed)
Continue weighing daily and call for an overnight weight gain of > 2 pounds or a weekly weight gain of >5 pounds. 

## 2016-09-07 ENCOUNTER — Emergency Department: Payer: Medicare Other

## 2016-09-07 ENCOUNTER — Encounter: Payer: Self-pay | Admitting: *Deleted

## 2016-09-07 ENCOUNTER — Inpatient Hospital Stay
Admission: EM | Admit: 2016-09-07 | Discharge: 2016-09-12 | DRG: 291 | Disposition: A | Discharge status: Home / Self Care | Payer: Medicare Other | Attending: Internal Medicine | Admitting: Internal Medicine

## 2016-09-07 DIAGNOSIS — I5023 Acute on chronic systolic (congestive) heart failure: Secondary | ICD-10-CM | POA: Diagnosis present

## 2016-09-07 DIAGNOSIS — I130 Hypertensive heart and chronic kidney disease with heart failure and stage 1 through stage 4 chronic kidney disease, or unspecified chronic kidney disease: Secondary | ICD-10-CM | POA: Diagnosis not present

## 2016-09-07 DIAGNOSIS — N400 Enlarged prostate without lower urinary tract symptoms: Secondary | ICD-10-CM | POA: Diagnosis present

## 2016-09-07 DIAGNOSIS — E875 Hyperkalemia: Secondary | ICD-10-CM

## 2016-09-07 DIAGNOSIS — Z87891 Personal history of nicotine dependence: Secondary | ICD-10-CM

## 2016-09-07 DIAGNOSIS — R2681 Unsteadiness on feet: Secondary | ICD-10-CM

## 2016-09-07 DIAGNOSIS — Z9049 Acquired absence of other specified parts of digestive tract: Secondary | ICD-10-CM

## 2016-09-07 DIAGNOSIS — I493 Ventricular premature depolarization: Secondary | ICD-10-CM | POA: Diagnosis present

## 2016-09-07 DIAGNOSIS — I472 Ventricular tachycardia: Secondary | ICD-10-CM | POA: Diagnosis present

## 2016-09-07 DIAGNOSIS — I5022 Chronic systolic (congestive) heart failure: Secondary | ICD-10-CM | POA: Diagnosis present

## 2016-09-07 DIAGNOSIS — Z7982 Long term (current) use of aspirin: Secondary | ICD-10-CM | POA: Diagnosis not present

## 2016-09-07 DIAGNOSIS — E785 Hyperlipidemia, unspecified: Secondary | ICD-10-CM | POA: Diagnosis present

## 2016-09-07 DIAGNOSIS — Z66 Do not resuscitate: Secondary | ICD-10-CM | POA: Diagnosis present

## 2016-09-07 DIAGNOSIS — N184 Chronic kidney disease, stage 4 (severe): Secondary | ICD-10-CM | POA: Diagnosis present

## 2016-09-07 DIAGNOSIS — N179 Acute kidney failure, unspecified: Secondary | ICD-10-CM

## 2016-09-07 DIAGNOSIS — Z79899 Other long term (current) drug therapy: Secondary | ICD-10-CM | POA: Diagnosis not present

## 2016-09-07 DIAGNOSIS — Z8249 Family history of ischemic heart disease and other diseases of the circulatory system: Secondary | ICD-10-CM

## 2016-09-07 DIAGNOSIS — K219 Gastro-esophageal reflux disease without esophagitis: Secondary | ICD-10-CM | POA: Diagnosis present

## 2016-09-07 DIAGNOSIS — I429 Cardiomyopathy, unspecified: Secondary | ICD-10-CM | POA: Diagnosis present

## 2016-09-07 DIAGNOSIS — Z9861 Coronary angioplasty status: Secondary | ICD-10-CM | POA: Diagnosis not present

## 2016-09-07 DIAGNOSIS — I2510 Atherosclerotic heart disease of native coronary artery without angina pectoris: Secondary | ICD-10-CM | POA: Diagnosis present

## 2016-09-07 DIAGNOSIS — Z951 Presence of aortocoronary bypass graft: Secondary | ICD-10-CM | POA: Diagnosis not present

## 2016-09-07 DIAGNOSIS — I248 Other forms of acute ischemic heart disease: Secondary | ICD-10-CM | POA: Diagnosis present

## 2016-09-07 DIAGNOSIS — I509 Heart failure, unspecified: Secondary | ICD-10-CM

## 2016-09-07 DIAGNOSIS — M6281 Muscle weakness (generalized): Secondary | ICD-10-CM

## 2016-09-07 LAB — BRAIN NATRIURETIC PEPTIDE

## 2016-09-07 LAB — CBC
HCT: 44.1 % (ref 40.0–52.0)
Hemoglobin: 14.4 g/dL (ref 13.0–18.0)
MCH: 26.5 pg (ref 26.0–34.0)
MCHC: 32.6 g/dL (ref 32.0–36.0)
MCV: 81.4 fL (ref 80.0–100.0)
PLATELETS: 120 10*3/uL — AB (ref 150–440)
RBC: 5.42 MIL/uL (ref 4.40–5.90)
RDW: 18.1 % — AB (ref 11.5–14.5)
WBC: 9.5 10*3/uL (ref 3.8–10.6)

## 2016-09-07 LAB — BASIC METABOLIC PANEL
Anion gap: 15 (ref 5–15)
BUN: 98 mg/dL — AB (ref 6–20)
CHLORIDE: 107 mmol/L (ref 101–111)
CO2: 21 mmol/L — AB (ref 22–32)
Calcium: 9.8 mg/dL (ref 8.9–10.3)
Creatinine, Ser: 3.45 mg/dL — ABNORMAL HIGH (ref 0.61–1.24)
GFR calc Af Amer: 16 mL/min — ABNORMAL LOW (ref >60)
GFR calc non Af Amer: 14 mL/min — ABNORMAL LOW (ref >60)
GLUCOSE: 119 mg/dL — AB (ref 65–99)
POTASSIUM: 5.4 mmol/L — AB (ref 3.5–5.1)
Sodium: 143 mmol/L (ref 135–145)

## 2016-09-07 LAB — RAPID INFLUENZA A&B ANTIGENS (ARMC ONLY)
INFLUENZA A (ARMC): NEGATIVE
INFLUENZA B (ARMC): NEGATIVE

## 2016-09-07 LAB — TROPONIN I
TROPONIN I: 0.77 ng/mL — AB (ref <0.03)
Troponin I: 1.57 ng/mL (ref <0.03)

## 2016-09-07 MED ORDER — ASPIRIN 81 MG PO CHEW
324.0000 mg | CHEWABLE_TABLET | Freq: Once | ORAL | Status: AC
  Administered 2016-09-07: 324 mg via ORAL
  Filled 2016-09-07: qty 4

## 2016-09-07 MED ORDER — FUROSEMIDE 10 MG/ML IJ SOLN
20.0000 mg | Freq: Two times a day (BID) | INTRAMUSCULAR | Status: DC
  Administered 2016-09-07 – 2016-09-09 (×4): 20 mg via INTRAVENOUS
  Filled 2016-09-07: qty 2
  Filled 2016-09-07: qty 4
  Filled 2016-09-07 (×2): qty 2

## 2016-09-07 MED ORDER — LEVOFLOXACIN IN D5W 750 MG/150ML IV SOLN
750.0000 mg | Freq: Once | INTRAVENOUS | Status: AC
  Administered 2016-09-07: 750 mg via INTRAVENOUS
  Filled 2016-09-07: qty 150

## 2016-09-07 MED ORDER — ALBUTEROL SULFATE (2.5 MG/3ML) 0.083% IN NEBU
5.0000 mg | INHALATION_SOLUTION | Freq: Once | RESPIRATORY_TRACT | Status: AC
  Administered 2016-09-07: 5 mg via RESPIRATORY_TRACT
  Filled 2016-09-07: qty 6

## 2016-09-07 MED ORDER — SODIUM CHLORIDE 0.9 % IV SOLN
1.0000 g | Freq: Once | INTRAVENOUS | Status: AC
  Administered 2016-09-07: 1 g via INTRAVENOUS
  Filled 2016-09-07 (×2): qty 10

## 2016-09-07 MED ORDER — NITROGLYCERIN 2 % TD OINT
0.5000 [in_us] | TOPICAL_OINTMENT | Freq: Four times a day (QID) | TRANSDERMAL | Status: DC
  Administered 2016-09-07 – 2016-09-08 (×6): 0.5 [in_us] via TOPICAL
  Filled 2016-09-07 (×7): qty 1

## 2016-09-07 NOTE — ED Notes (Signed)
Dr Sharma CovertNorman notified of elevated troponin - pt denies chest pain - reports shortness of breath and at this time o2 sat remaining at 85% - placed on 2l via n/c

## 2016-09-07 NOTE — H&P (Signed)
[employer] - [place] at [facility]   PATIENT NAME: Craig Gallegos Marinaccio    MR#:  [id number]  DATE OF BIRTH:  Jan 02, 1920  DATE OF ADMISSION:  09/07/2016  PRIMARY CARE PHYSICIAN: Lynnea FerrierBERT J KLEIN III, MD   REQUESTING/REFERRING PHYSICIAN:  Placido Souarolyn Ann  CHIEF COMPLAINT:   Chief Complaint  Patient presents with  . Shortness of Breath    HISTORY OF PRESENT ILLNESS: Craig Gallegos Mcfate  is a 83 y.o. male with a known history of Benign prostate hypertrophy, systolic congestive heart failure ejection fraction 15%, chronic kidney disease, coronary artery disease, hyperlipidemia, hypertension - lives in independent living facility and completely independent. For last few days he started feeling worsening in his shortness of breath in spite of taking all his medications on time. He denies any associated cough, chest pain, fever, chills. He noticed some leg swelling. In ER he was noted to have pulmonary edema on chest x-ray and acute systolic congestive heart failure. His renal function was noted slightly worse than his baseline and chest x-ray also showed some possibility of atelectasis versus pneumonia on the right lung.  PAST MEDICAL HISTORY:   Past Medical History:  Diagnosis Date  . BPH (benign prostatic hyperplasia)   . CHF (congestive heart failure) (HCC)    EF 15% from recent ECHO in Nov 2017  . CKD (chronic kidney disease) stage 4, GFR 15-29 ml/min (HCC)   . Coronary artery disease    angioplasty > 30 years ago  . Hyperlipidemia   . Hypertension     PAST SURGICAL HISTORY: Past Surgical History:  Procedure Laterality Date  . APPENDECTOMY    . CORONARY ANGIOPLASTY  1986    SOCIAL HISTORY:  Social History  Substance Use Topics  . Smoking status: Former Smoker    Types: Pipe  . Smokeless tobacco: Never Used  . Alcohol use 1.8 oz/week    3 Glasses of wine per week    FAMILY HISTORY:  Family History  Problem Relation Age of Onset  . Heart failure Father   . Heart failure  Brother     DRUG ALLERGIES:  Allergies  Allergen Reactions  . Horse-Derived Products Hives    REVIEW OF SYSTEMS:   CONSTITUTIONAL: No fever, fatigue or weakness.  EYES: No blurred or double vision.  EARS, NOSE, AND THROAT: No tinnitus or ear pain.  RESPIRATORY: No cough, Positive for shortness of breath,no  wheezing or hemoptysis.  CARDIOVASCULAR: No chest pain, orthopnea,positive for  edema.  GASTROINTESTINAL: No nausea, vomiting, diarrhea or abdominal pain.  GENITOURINARY: No dysuria, hematuria.  ENDOCRINE: No polyuria, nocturia,  HEMATOLOGY: No anemia, easy bruising or bleeding SKIN: No rash or lesion. MUSCULOSKELETAL: No joint pain or arthritis.   NEUROLOGIC: No tingling, numbness, weakness.  PSYCHIATRY: No anxiety or depression.   MEDICATIONS AT HOME:  Prior to Admission medications   Medication Sig Start Date End Date Taking? Authorizing Provider  acetaminophen (TYLENOL) 325 MG tablet Take 2 tablets (650 mg total) by mouth every 6 (six) hours as needed for mild pain (or Fever >/= 101). 05/26/15  Yes Leotis ShamesJasmine Singh, MD  aspirin EC 325 MG tablet Take 325 mg by mouth daily.   Yes Historical Provider, MD  cholecalciferol (VITAMIN D) 1000 UNITS tablet Take 1,000 Units by mouth at bedtime.   Yes Historical Provider, MD  furosemide (LASIX) 40 MG tablet Take 40 mg by mouth daily.    Yes Historical Provider, MD  isosorbide mononitrate (IMDUR) 30 MG 24 hr tablet Take 30 mg by mouth  daily.   Yes Historical Provider, MD  losartan (COZAAR) 100 MG tablet Take 100 mg by mouth daily.    Yes Historical Provider, MD  metoprolol succinate (TOPROL-XL) 25 MG 24 hr tablet Take 25 mg by mouth daily.   Yes Historical Provider, MD  Multiple Vitamins-Minerals (PRESERVISION AREDS 2 PO) Take 1 capsule by mouth 2 (two) times daily.    Yes Historical Provider, MD  nitroGLYCERIN (NITROSTAT) 0.4 MG SL tablet Place 0.4 mg under the tongue every 5 (five) minutes as needed for chest pain.    Yes Historical  Provider, MD  omeprazole (PRILOSEC) 20 MG capsule Take 20 mg by mouth at bedtime.   Yes Historical Provider, MD  simvastatin (ZOCOR) 20 MG tablet Take 20 mg by mouth daily at 6 PM.  05/23/16  Yes Historical Provider, MD      PHYSICAL EXAMINATION:   VITAL SIGNS: Blood pressure (!) 163/110, pulse 70, temperature 97.4 F (36.3 C), temperature source Axillary, resp. rate (!) 22, height 5\' 8"  (1.727 m), weight 77.1 kg (170 lb), SpO2 100 %.  GENERAL:  83 y.o.-year-old patient lying in the bed with no acute distress.  EYES: Pupils equal, round, reactive to light and accommodation. No scleral icterus. Extraocular muscles intact.  HEENT: Head atraumatic, normocephalic. Oropharynx and nasopharynx clear.  NECK:  Supple, no jugular venous distention. No thyroid enlargement, no tenderness.  LUNGS: Normal breath sounds bilaterally, no wheezing,Some  crepitation. No use of accessory muscles of respiration.  CARDIOVASCULAR: S1, S2 normal. No murmurs, rubs, or gallops.  ABDOMEN: Soft, nontender, nondistended. Bowel sounds present. No organomegaly or mass.  EXTREMITIES:Some  pedal edema,no  cyanosis, or clubbing.  NEUROLOGIC: Cranial nerves II through XII are intact. Muscle strength 5/5 in all extremities. Sensation intact. Gait not checked.  PSYCHIATRIC: The patient is alert and oriented x 3.  SKIN: No obvious rash, lesion, or ulcer.   LABORATORY PANEL:   CBC  Recent Labs Lab 09/07/16 1633  WBC 9.5  HGB 14.4  HCT 44.1  PLT 120*  MCV 81.4  MCH 26.5  MCHC 32.6  RDW 18.1*   ------------------------------------------------------------------------------------------------------------------  Chemistries   Recent Labs Lab 09/07/16 1633  NA 143  K 5.4*  CL 107  CO2 21*  GLUCOSE 119*  BUN 98*  CREATININE 3.45*  CALCIUM 9.8   ------------------------------------------------------------------------------------------------------------------ estimated creatinine clearance is 12.7 mL/min (by  C-G formula based on SCr of 3.45 mg/dL (H)). ------------------------------------------------------------------------------------------------------------------ No results for input(s): TSH, T4TOTAL, T3FREE, THYROIDAB in the last 72 hours.  Invalid input(s): FREET3   Coagulation profile No results for input(s): INR, PROTIME in the last 168 hours. ------------------------------------------------------------------------------------------------------------------- No results for input(s): DDIMER in the last 72 hours. -------------------------------------------------------------------------------------------------------------------  Cardiac Enzymes  Recent Labs Lab 09/07/16 1633  TROPONINI 0.77*   ------------------------------------------------------------------------------------------------------------------ Invalid input(s): POCBNP  ---------------------------------------------------------------------------------------------------------------  Urinalysis    Component Value Date/Time   COLORURINE YELLOW (A) 07/15/2015 0356   APPEARANCEUR CLEAR (A) 07/15/2015 0356   LABSPEC 1.036 (H) 07/15/2015 0356   PHURINE 5.0 07/15/2015 0356   GLUCOSEU NEGATIVE 07/15/2015 0356   HGBUR NEGATIVE 07/15/2015 0356   BILIRUBINUR NEGATIVE 07/15/2015 0356   KETONESUR NEGATIVE 07/15/2015 0356   PROTEINUR NEGATIVE 07/15/2015 0356   NITRITE NEGATIVE 07/15/2015 0356   LEUKOCYTESUR NEGATIVE 07/15/2015 0356     RADIOLOGY: Dg Chest 2 View  Result Date: 09/07/2016 CLINICAL DATA:  Shortness of breath for 1 year. Increased over the last 1 week. EXAM: CHEST  2 VIEW COMPARISON:  Two-view chest x-ray 07/25/2016 FINDINGS: The heart is enlarged.  Atherosclerotic calcifications of the aorta are again noted. Previously noted right lower lobe airspace disease has cleared. Ill-defined right middle lobe opacities are present. The pulmonary arteries are prominent bilaterally. Degenerative changes are noted in the  thoracic spine. IMPRESSION: 1. Ill-defined right middle lobe airspace disease reflecting atelectasis or early infection. 2. The left lung is clear. 3. Cardiomegaly without failure. 4. Aortic atherosclerosis. Electronically Signed   By: Marin Roberts M.D.   On: 09/07/2016 18:48    EKG: Orders placed or performed during the hospital encounter of 09/07/16  . EKG 12-Lead  . EKG 12-Lead  . ED EKG  . ED EKG    IMPRESSION AND PLAN:  * ac on ch systolic CHF   IV lasix, I/O monitor, Monitor.  * Ac on [facility] renal failure   Monitor as on lasix.  * Hyperlipidemia   Continue statin.   * Hypertension    Continue home medication.   All the records are reviewed and case discussed with ED provider. Management plans discussed with the patient, family and they are in agreement.  CODE STATUS:DO NOT RESUSCITATE  Code Status History    Date Active Date Inactive Code Status Order ID Comments User Context   07/25/2016  2:06 PM 07/27/2016  3:16 PM Full Code [id number]  Enid Baas, MD ED   07/15/2015  9:08 AM 07/17/2015  4:08 PM Full Code [id number]  Shaune Pollack, MD Inpatient   07/15/2015  4:52 AM 07/15/2015  4:52 AM Full Code [id number]  Arnaldo Natal, MD ED   07/15/2015  4:51 AM 07/15/2015  4:52 AM DNR [id number]  Arnaldo Natal, MD ED   05/25/2015 11:15 AM 05/26/2015  5:53 PM Full Code [id number]  Katha Hamming, MD ED       TOTAL TIME TAKING CARE OF THIS PATIENT: 50  minutes.    Altamese Dilling M.D on 09/07/2016   Between 7am to 6pm - Pager - [phone]  After 6pm go to [email or web address] - password [email or web address]  [facility] [place] [facility]  Office  [phone]  CC: Primary care physician; Lynnea Ferrier, MD   Note: This dictation was prepared with Dragon dictation along with smaller phrase technology. Any transcriptional errors that result from this process are unintentional.

## 2016-09-07 NOTE — ED Provider Notes (Addendum)
[facility] Emergency Department Provider Note  ____________________________________________  Time seen: Approximately 5:26 PM  I have reviewed the triage vital signs and the nursing notes.   HISTORY  Chief Complaint Shortness of Breath    HPI Craig Gallegos is a 83 y.o. male with a history of CAD status post angioplasty in the 1980s, CHF with an EF of 15%, HTN, HL, presenting with shortness of breath. The patient reports that for the past several months he has had a progressively worsening exertional shortness of breath. This morning, he ambulated to the bathroom and "I was huffing and puffing more than usual." "When I am at rest, I am fine." He does not ever experienced chest pain, tightness or pressure. He has no associated palpitations, lightheadedness or syncope. He has noted increasing bilateral lower extremity swelling, and intermittent +/- 2 pound weight gain and weight loss.   Past Medical History:  Diagnosis Date  . BPH (benign prostatic hyperplasia)   . CHF (congestive heart failure) (HCC)    EF 15% from recent ECHO in Nov 2017  . CKD (chronic kidney disease) stage 4, GFR 15-29 ml/min (HCC)   . Coronary artery disease    angioplasty > 30 years ago  . Hyperlipidemia   . Hypertension     Patient Active Problem List   Diagnosis Date Noted  . Bacteremia 07/17/2015  . Chest pain 07/15/2015  . NSTEMI (non-ST elevated myocardial infarction) (HCC) 07/15/2015  . Chronic systolic heart failure (HCC) 06/12/2015  . Essential hypertension 06/12/2015    Past Surgical History:  Procedure Laterality Date  . APPENDECTOMY    . CORONARY ANGIOPLASTY  1986    Current Outpatient Rx  . Order #: [id number] Class: Normal  . Order #: [id number] Class: Historical Med  . Order #: [id number] Class: Historical Med  . Order #: [id number] Class: Historical Med  . Order #: [id number] Class: Historical Med  . Order #: [id number] Class: Historical Med  . Order #:  [id number] Class: Historical Med  . Order #: [id number] Class: Historical Med  . Order #: [id number] Class: Historical Med  . Order #: [id number] Class: Historical Med  . Order #: [id number] Class: Historical Med    Allergies Horse-derived products  Family History  Problem Relation Age of Onset  . Heart failure Father   . Heart failure Brother     Social History Social History  Substance Use Topics  . Smoking status: Former Smoker    Types: Pipe  . Smokeless tobacco: Never Used  . Alcohol use 1.8 oz/week    3 Glasses of wine per week    Review of Systems Constitutional: No fever/chills.No lightheadedness or syncope. Eyes: No visual changes. ENT: No sore throat. No congestion or rhinorrhea. Cardiovascular: Denies chest pain. Denies palpitations. Respiratory: Positive exertional shortness of breath.  No cough. Gastrointestinal: No abdominal pain.  No nausea, no vomiting.  No diarrhea.  No constipation. Genitourinary: Negative for dysuria. Musculoskeletal: Negative for back pain. Positive bilateral lower extremity swelling. Skin: Negative for rash. Neurological: Negative for headaches. No focal numbness, tingling or weakness.   10-point ROS otherwise negative.  ____________________________________________   PHYSICAL EXAM:  VITAL SIGNS: ED Triage Vitals  Enc Vitals Group     BP 09/07/16 1634 (!) 163/110     Pulse Rate 09/07/16 1630 70     Resp 09/07/16 1630 (!) 22     Temp 09/07/16 1630 97.4 F (36.3 C)     Temp Source 09/07/16 1630 Axillary     SpO2 09/07/16 1630 100 %  Weight 09/07/16 1629 170 lb (77.1 kg)     Height 09/07/16 1629 5\' 8"  (1.727 m)     Head Circumference --      Peak Flow --      Pain Score --      Pain Loc --      Pain Edu? --      Excl. in GC? --     Constitutional: Alert and oriented. Chronically ill appearing but in no acute distress. Answers questions appropriately. Eyes: Conjunctivae are normal.  EOMI. No scleral icterus. Head:  Atraumatic. Nose: No congestion/rhinnorhea. Mouth/Throat: Mucous membranes are moist.  Neck: No stridor.  Supple.  Positive JVD. Cardiovascular: Normal rate, regular rhythm. No murmurs, rubs or gallops.  Respiratory: Mild tachypnea with mild accessory muscle use but no retractions. O2 sats are 87-89% with a good tracing on my examination; oxygen improves to 92-95% with 2 L nasal cannula. Lungs CTAB.  No wheezes, rales or ronchi. Gastrointestinal: Soft, nontender and nondistended.  No guarding or rebound.  No peritoneal signs. Musculoskeletal: Bilateral symmetric lower extremity edema to the proximal tibia. No ttp in the calves or palpable cords.  Negative Homan's sign. Neurologic:  A&Ox3.  Speech is clear.  Face and smile are symmetric.  EOMI.  Moves all extremities well. Skin:  Skin is warm, dry and intact. No rash noted. Psychiatric: Mood and affect are normal. Speech and behavior are normal.  Normal judgement.  ____________________________________________   LABS (all labs ordered are listed, but only abnormal results are displayed)  Labs Reviewed  BASIC METABOLIC PANEL - Abnormal; Notable for the following:       Result Value   Potassium 5.4 (*)    CO2 21 (*)    Glucose, Bld 119 (*)    BUN 98 (*)    Creatinine, Ser 3.45 (*)    GFR calc non Af Amer 14 (*)    GFR calc Af Amer 16 (*)    All other components within normal limits  CBC - Abnormal; Notable for the following:    RDW 18.1 (*)    Platelets 120 (*)    All other components within normal limits  TROPONIN I - Abnormal; Notable for the following:    Troponin I 0.77 (*)    All other components within normal limits  BRAIN NATRIURETIC PEPTIDE   ____________________________________________  EKG  ED ECG REPORT I, Rockne MenghiniNorman, Anne-Caroline, the attending physician, personally viewed and interpreted this ECG.   Date: 09/07/2016  EKG Time: 1631  Rate: 67  Rhythm: normal sinus rhythm with frequent PVC's and LBBB  Axis:  leftward  Intervals:Prolonged QTc  ST&T Change:  No STEMI This EKG is compared to previous 11/17; the left bundle branch block is present on the previous EKG and the general morphology is unchanged.  Repeat EKG performed by nurse for frequent PVCs  ED ECG REPORT I, Rockne MenghiniNorman, Anne-Caroline, the attending physician, personally viewed and interpreted this ECG.   Date: 09/07/2016  EKG Time: 1713  Rate: 71  Rhythm: normal sinus rhythm; LBBB; frequent PVCs  Axis: leftward  Intervals:none  ST&T Change: No ST elevation. Morphology is grossly unchanged compared to today's previous EKG.  ____________________________________________  RADIOLOGY  No results found.  ____________________________________________   PROCEDURES  Procedure(s) performed: None  Procedures  Critical Care performed: Yes ____________________________________________   INITIAL IMPRESSION / ASSESSMENT AND PLAN / ED COURSE  Pertinent labs & imaging results that were available during my care of the patient were reviewed by me and considered in  my medical decision making (see chart for details).  83 y.o. male with a history of CAD presenting with shortness of breath, is hypoxic here. Overall, the patient's clinical picture is most consistent with a progressive acute on chronic congestive heart failure exacerbation. Today, the patient has peripheral fluid overload, and acute on chronic renal insufficiency with mild hyperkalemia, and hypoxia which I am concerned may be from pulmonary edema. Other possible etiologies include ACS or MI although the patient is not been having any chest discomfort, this is still possible. Less likely possibility of PE although this is also on the differential. We'll treat the patient with nitroglycerin ointment to see if this will improve his hypertension, and hypoxia by moving fluid out of his lungs. The patient will also be given a full dose aspirin; no anticoagulation or heparin is indicated  at this time. The patient does have an elevated troponin, higher than his previous, which will need to be trended. This may be due to ACS, or it may be from strain due to his hypoxia. A BNP is still pending. Plan admission.  ----------------------------------------- 7:04 PM on 09/07/2016 -----------------------------------------  The patient does have some abnormalities in the right middle lobe; this may be atelectasis but I am concerned about pneumonia given his cough and hypoxia so blood cultures have been drawn and antibiotics have been ordered. The patient will also need a cardiac evaluation. Plan admission at this time.  CRITICAL CARE Performed by: Rockne Menghini   Total critical care time: 40 minutes  Critical care time was exclusive of separately billable procedures and treating other patients.  Critical care was necessary to treat or prevent imminent or life-threatening deterioration.  Critical care was time spent personally by me on the following activities: development of treatment plan with patient and/or surrogate as well as nursing, discussions with consultants, evaluation of patient's response to treatment, examination of patient, obtaining history from patient or surrogate, ordering and performing treatments and interventions, ordering and review of laboratory studies, ordering and review of radiographic studies, pulse oximetry and re-evaluation of patient's condition.   ____________________________________________  FINAL CLINICAL IMPRESSION(S) / ED DIAGNOSES  Final diagnoses:  Acute on chronic congestive heart failure, unspecified congestive heart failure type (HCC)  Acute renal failure, unspecified acute renal failure type (HCC)  Hyperkalemia  Frequent PVCs    Clinical Course       NEW MEDICATIONS STARTED DURING THIS VISIT:  New Prescriptions   No medications on file      Rockne Menghini, MD 09/07/16 1735    Rockne Menghini,  MD 09/07/16 1811    Rockne Menghini, MD 09/07/16 2351

## 2016-09-07 NOTE — ED Triage Notes (Signed)
Pt complains of shortness of breath, worse today, pt denies fever and cough, pt is dyspneic at rest and with exertion

## 2016-09-07 NOTE — Consult Note (Signed)
Pharmacy Antibiotic Note  Craig Gallegos is a 83 y.o. male admitted on 09/07/2016 with pneumonia.  Pharmacy has been consulted for levoflxoacin dosing.  Plan: levofloxacin 750mg  once then 500mg  q 48 hours due to renal function. total of 5day of treatment  Height: 5\' 8"  (172.7 cm) Weight: 170 lb (77.1 kg) IBW/kg (Calculated) : 68.4  Temp (24hrs), Avg:97.4 F (36.3 C), Min:97.4 F (36.3 C), Max:97.4 F (36.3 C)   Recent Labs Lab 09/07/16 1633  WBC 9.5  CREATININE 3.45*    Estimated Creatinine Clearance: 12.7 mL/min (by C-G formula based on SCr of 3.45 mg/dL (H)).    Allergies  Allergen Reactions  . Horse-Derived Products Hives    Antimicrobials this admission: levofloxacin 1/3 >>    Dose adjustments this admission:   Microbiology results:   Thank you for allowing pharmacy to be a part of this patient's care.  Olene FlossMelissa D Verenise Moulin, Pharm.D, BCPS Clinical Pharmacist  09/07/2016 8:18 PM

## 2016-09-07 NOTE — Progress Notes (Signed)
Family Meeting Note  Advance Directive:yes  Today a meeting took place with the Patient.  The following clinical team members were present during this meeting:MD  The following were discussed:Patient's diagnosis: severe systolic CHF, Patient's progosis: Unable to determine and Goals for treatment: DNR  Additional follow-up to be provided: PMD  Time spent during discussion:20 minutes  Craig Gallegos, Heath GoldVAIBHAVKUMAR, MD

## 2016-09-07 NOTE — ED Notes (Addendum)
Pt c/o shortness of breath for the last week  - denies any illness - spouse states he has been short of breath for over a year - pt had home health nurse visit today as did the occupational therapist they reported that pt has no cough or fever and all vital signs were stable - spouse brought pt to the ed for eval because nurses could not tell her why the pt was short of breath - at this time pt is laying in bed with respirations that are even and unlabored - pt appears in no distress - O2 sat however is fluctuating between 89% and 95% on RA

## 2016-09-07 NOTE — ED Notes (Signed)
Called pharmacy to send calcium gluconate

## 2016-09-08 ENCOUNTER — Inpatient Hospital Stay: Payer: Medicare Other

## 2016-09-08 LAB — CBC
HCT: 40.2 % (ref 40.0–52.0)
HEMOGLOBIN: 13.2 g/dL (ref 13.0–18.0)
MCH: 26.5 pg (ref 26.0–34.0)
MCHC: 32.9 g/dL (ref 32.0–36.0)
MCV: 80.4 fL (ref 80.0–100.0)
PLATELETS: 95 10*3/uL — AB (ref 150–440)
RBC: 5 MIL/uL (ref 4.40–5.90)
RDW: 17.9 % — ABNORMAL HIGH (ref 11.5–14.5)
WBC: 6.9 10*3/uL (ref 3.8–10.6)

## 2016-09-08 LAB — BASIC METABOLIC PANEL
Anion gap: 11 (ref 5–15)
BUN: 105 mg/dL — ABNORMAL HIGH (ref 6–20)
CO2: 23 mmol/L (ref 22–32)
CREATININE: 3.31 mg/dL — AB (ref 0.61–1.24)
Calcium: 9.4 mg/dL (ref 8.9–10.3)
Chloride: 108 mmol/L (ref 101–111)
GFR, EST AFRICAN AMERICAN: 17 mL/min — AB (ref >60)
GFR, EST NON AFRICAN AMERICAN: 15 mL/min — AB (ref >60)
Glucose, Bld: 105 mg/dL — ABNORMAL HIGH (ref 65–99)
Potassium: 4.8 mmol/L (ref 3.5–5.1)
SODIUM: 142 mmol/L (ref 135–145)

## 2016-09-08 LAB — PROCALCITONIN: Procalcitonin: 0.33 ng/mL

## 2016-09-08 LAB — ALBUMIN: ALBUMIN: 3.6 g/dL (ref 3.5–5.0)

## 2016-09-08 LAB — MRSA PCR SCREENING: MRSA BY PCR: NEGATIVE

## 2016-09-08 MED ORDER — PANTOPRAZOLE SODIUM 40 MG PO TBEC
40.0000 mg | DELAYED_RELEASE_TABLET | Freq: Every day | ORAL | Status: DC
  Administered 2016-09-08 – 2016-09-12 (×5): 40 mg via ORAL
  Filled 2016-09-08 (×5): qty 1

## 2016-09-08 MED ORDER — SODIUM CHLORIDE 0.9% FLUSH
3.0000 mL | Freq: Two times a day (BID) | INTRAVENOUS | Status: DC
  Administered 2016-09-08 – 2016-09-12 (×10): 3 mL via INTRAVENOUS

## 2016-09-08 MED ORDER — ORAL CARE MOUTH RINSE
15.0000 mL | Freq: Two times a day (BID) | OROMUCOSAL | Status: DC
  Administered 2016-09-08 – 2016-09-09 (×4): 15 mL via OROMUCOSAL

## 2016-09-08 MED ORDER — ISOSORBIDE MONONITRATE ER 60 MG PO TB24
30.0000 mg | ORAL_TABLET | Freq: Every day | ORAL | Status: DC
  Administered 2016-09-08: 10:00:00 30 mg via ORAL
  Filled 2016-09-08: qty 1

## 2016-09-08 MED ORDER — SIMVASTATIN 20 MG PO TABS
20.0000 mg | ORAL_TABLET | Freq: Every day | ORAL | Status: DC
  Administered 2016-09-08 – 2016-09-12 (×5): 20 mg via ORAL
  Filled 2016-09-08 (×5): qty 1

## 2016-09-08 MED ORDER — METOPROLOL SUCCINATE ER 25 MG PO TB24
25.0000 mg | ORAL_TABLET | Freq: Every day | ORAL | Status: DC
  Administered 2016-09-08 – 2016-09-09 (×2): 25 mg via ORAL
  Filled 2016-09-08 (×2): qty 1

## 2016-09-08 MED ORDER — LEVOFLOXACIN IN D5W 500 MG/100ML IV SOLN
500.0000 mg | INTRAVENOUS | Status: DC
  Filled 2016-09-08: qty 100

## 2016-09-08 MED ORDER — HEPARIN SODIUM (PORCINE) 5000 UNIT/ML IJ SOLN
5000.0000 [IU] | Freq: Three times a day (TID) | INTRAMUSCULAR | Status: DC
  Administered 2016-09-08 – 2016-09-12 (×14): 5000 [IU] via SUBCUTANEOUS
  Filled 2016-09-08 (×15): qty 1

## 2016-09-08 MED ORDER — ASPIRIN EC 325 MG PO TBEC
325.0000 mg | DELAYED_RELEASE_TABLET | Freq: Every day | ORAL | Status: DC
  Administered 2016-09-08 – 2016-09-12 (×5): 325 mg via ORAL
  Filled 2016-09-08 (×5): qty 1

## 2016-09-08 MED ORDER — OCUVITE-LUTEIN PO CAPS
1.0000 | ORAL_CAPSULE | Freq: Two times a day (BID) | ORAL | Status: DC
  Administered 2016-09-08 – 2016-09-12 (×9): 1 via ORAL
  Filled 2016-09-08 (×10): qty 1

## 2016-09-08 MED ORDER — LOSARTAN POTASSIUM 50 MG PO TABS
100.0000 mg | ORAL_TABLET | Freq: Every day | ORAL | Status: DC
  Administered 2016-09-08: 100 mg via ORAL
  Filled 2016-09-08: qty 2

## 2016-09-08 MED ORDER — VITAMIN D 1000 UNITS PO TABS
1000.0000 [IU] | ORAL_TABLET | Freq: Every day | ORAL | Status: DC
  Administered 2016-09-08 – 2016-09-11 (×5): 1000 [IU] via ORAL
  Filled 2016-09-08 (×5): qty 1

## 2016-09-08 NOTE — Consult Note (Signed)
Date: 09/08/2016                  Patient Name:  Craig Gallegos  MRN: [id number]  DOB: 1920/07/07  Age / Sex: 83 y.o., male         PCP: Lynnea Ferrier, MD                 Service Requesting Consult: IM/Dr Eliezer Champagne                 Reason for Consult: ARF            History of Present Illness: Patient is a 83 y.o. male with medical problems of BPH, Ch Sys CHF EF 15 %, CAD, HLD, HTN, who was admitted to [facility] on 09/07/2016 for evaluation of worsening SOB ER eval - increased BNP CXR not impressive for acute Pulm edema Renal U/S neg for hydronephrosis - does show distended IVC and prostatic hypertrophy BP is low    Medications: Outpatient medications: Prescriptions Prior to Admission  Medication Sig Dispense Refill Last Dose  . acetaminophen (TYLENOL) 325 MG tablet Take 2 tablets (650 mg total) by mouth every 6 (six) hours as needed for mild pain (or Fever >/= 101). 30 tablet 0 prn at prn  . aspirin EC 325 MG tablet Take 325 mg by mouth daily.   unknown at unknown  . cholecalciferol (VITAMIN D) 1000 UNITS tablet Take 1,000 Units by mouth at bedtime.   unknown at unknown  . furosemide (LASIX) 40 MG tablet Take 40 mg by mouth daily.    unknown at unknown  . isosorbide mononitrate (IMDUR) 30 MG 24 hr tablet Take 30 mg by mouth daily.   unknown at unknown  . losartan (COZAAR) 100 MG tablet Take 100 mg by mouth daily.    unknown at unknown  . metoprolol succinate (TOPROL-XL) 25 MG 24 hr tablet Take 25 mg by mouth daily.   unknown at unknown  . Multiple Vitamins-Minerals (PRESERVISION AREDS 2 PO) Take 1 capsule by mouth 2 (two) times daily.    09/07/2016 at am  . nitroGLYCERIN (NITROSTAT) 0.4 MG SL tablet Place 0.4 mg under the tongue every 5 (five) minutes as needed for chest pain.    unknown at unknown  . omeprazole (PRILOSEC) 20 MG capsule Take 20 mg by mouth at bedtime.   09/06/2016 at unknown  . simvastatin (ZOCOR) 20 MG tablet Take 20 mg by mouth daily at 6 PM.   2 09/06/2016 at  unknown    Current medications: Current Facility-Administered Medications  Medication Dose Route Frequency Provider Last Rate Last Dose  . aspirin EC tablet 325 mg  325 mg Oral Daily Altamese Dilling, MD   325 mg at 09/08/16 0936  . cholecalciferol (VITAMIN D) tablet 1,000 Units  1,000 Units Oral QHS Altamese Dilling, MD   1,000 Units at 09/08/16 0214  . furosemide (LASIX) injection 20 mg  20 mg Intravenous Q12H Altamese Dilling, MD   20 mg at 09/08/16 0935  . heparin injection 5,000 Units  5,000 Units Subcutaneous Q8H Altamese Dilling, MD   5,000 Units at 09/08/16 1407  . [START ON 09/09/2016] levofloxacin (LEVAQUIN) IVPB 500 mg  500 mg Intravenous Q48H Melissa D Maccia, RPH      . MEDLINE mouth rinse  15 mL Mouth Rinse BID Altamese Dilling, MD   15 mL at 09/08/16 0936  . metoprolol succinate (TOPROL-XL) 24 hr tablet 25 mg  25 mg Oral Daily Altamese Dilling, MD  25 mg at 09/08/16 0936  . multivitamin-lutein (OCUVITE-LUTEIN) capsule 1 capsule  1 capsule Oral BID Altamese Dilling, MD   1 capsule at 09/08/16 0935  . nitroGLYCERIN (NITROGLYN) 2 % ointment 0.5 inch  0.5 inch Topical Q6H Rockne Menghini, MD   0.5 inch at 09/08/16 1701  . pantoprazole (PROTONIX) EC tablet 40 mg  40 mg Oral QAC breakfast Altamese Dilling, MD   40 mg at 09/08/16 0935  . simvastatin (ZOCOR) tablet 20 mg  20 mg Oral q1800 Altamese Dilling, MD   20 mg at 09/08/16 1701  . sodium chloride flush (NS) 0.9 % injection 3 mL  3 mL Intravenous Q12H Altamese Dilling, MD   3 mL at 09/08/16 0936      Allergies: Allergies  Allergen Reactions  . Horse-Derived Products Hives      Past Medical History: Past Medical History:  Diagnosis Date  . BPH (benign prostatic hyperplasia)   . CHF (congestive heart failure) (HCC)    EF 15% from recent ECHO in Nov 2017  . CKD (chronic kidney disease) stage 4, GFR 15-29 ml/min (HCC)   . Coronary artery disease    angioplasty > 30  years ago  . Hyperlipidemia   . Hypertension      Past Surgical History: Past Surgical History:  Procedure Laterality Date  . APPENDECTOMY    . CORONARY ANGIOPLASTY  1986     Family History: Family History  Problem Relation Age of Onset  . Heart failure Father   . Heart failure Brother      Social History: Social History   Social History  . Marital status: Widowed    Spouse name: N/A  . Number of children: N/A  . Years of education: N/A   Occupational History  . Not on file.   Social History Main Topics  . Smoking status: Former Smoker    Types: Pipe  . Smokeless tobacco: Never Used  . Alcohol use 1.8 oz/week    3 Glasses of wine per week  . Drug use: No  . Sexual activity: Not on file   Other Topics Concern  . Not on file   Social History Narrative   Lives at home with wife, ambulates independently however limited due to his breathing     Review of Systems: Gen: no fever chills HEENT: decreased hearing, no vision problems CV: excessive shortness of breath with exertion, no chest pain Resp: no cough or sputum GI: decreased appetite GU : no voiding problems , no blood in urine MS: no joint pains Derm:  no skin rashes Psych: no c/o Heme: no c/o Neuro: no c/o Endocrine. No c/o  Vital Signs: Blood pressure 108/70, pulse 60, temperature 98.6 F (37 C), temperature source Axillary, resp. rate 18, height 5\' 8"  (1.727 m), weight 77.1 kg (170 lb), SpO2 98 %.   Intake/Output Summary (Last 24 hours) at 09/08/16 1739 Last data filed at 09/08/16 1255  Gross per 24 hour  Intake              340 ml  Output              575 ml  Net             -235 ml    Weight trends: [employer]   09/07/16 1629  Weight: 77.1 kg (170 lb)    Physical Exam: General:  NAD, laying in bed  HEENT Anicteric, moist oral mucus membranes  Neck:  supple  Lungs: Clear b/l, [place]  oxygen  Heart::  irregular, tachycardic  Abdomen: Soft, NT  Extremities:  ++ edema   Neurologic: Alert, oreinted  Skin: Redness over legs, dry skin             Lab results: Basic Metabolic Panel:  Recent Labs Lab 09/07/16 1633 09/08/16 0513  NA 143 142  K 5.4* 4.8  CL 107 108  CO2 21* 23  GLUCOSE 119* 105*  BUN 98* 105*  CREATININE 3.45* 3.31*  CALCIUM 9.8 9.4    Liver Function Tests:  Recent Labs Lab 09/08/16 0513  ALBUMIN 3.6   No results for input(s): LIPASE, AMYLASE in the last 168 hours. No results for input(s): AMMONIA in the last 168 hours.  CBC:  Recent Labs Lab 09/07/16 1633 09/08/16 0513  WBC 9.5 6.9  HGB 14.4 13.2  HCT 44.1 40.2  MCV 81.4 80.4  PLT 120* 95*    Cardiac Enzymes:  Recent Labs Lab 09/07/16 2031  TROPONINI 1.57*    BNP: Invalid input(s): POCBNP  CBG: No results for input(s): GLUCAP in the last 168 hours.  Microbiology: Recent Results (from the past 720 hour(s))  Blood culture (routine x 2)     Status: None (Preliminary result)   Collection Time: 09/07/16  8:31 PM  Result Value Ref Range Status   Specimen Description BLOOD RIGHT ARM  Final   Special Requests   Final    BOTTLES DRAWN AEROBIC AND ANAEROBIC AER10CC AER11CC   Culture NO GROWTH < 12 HOURS  Final   Report Status PENDING  Incomplete  Blood culture (routine x 2)     Status: None (Preliminary result)   Collection Time: 09/07/16  8:31 PM  Result Value Ref Range Status   Specimen Description BLOOD LEFT AC  Final   Special Requests   Final    BOTTLES DRAWN AEROBIC AND ANAEROBIC ANA12CC AER10CC   Culture NO GROWTH < 12 HOURS  Final   Report Status PENDING  Incomplete  Rapid Influenza A&B Antigens ([facility] only)     Status: None   Collection Time: 09/07/16  8:31 PM  Result Value Ref Range Status   Influenza A ([facility]) NEGATIVE NEGATIVE Final   Influenza B ([facility]) NEGATIVE NEGATIVE Final  MRSA PCR Screening     Status: None   Collection Time: 09/08/16  2:15 AM  Result Value Ref Range Status   MRSA by PCR NEGATIVE NEGATIVE Final    Comment:         The GeneXpert MRSA Assay (FDA approved for NASAL specimens only), is one component of a comprehensive MRSA colonization surveillance program. It is not intended to diagnose MRSA infection nor to guide or monitor treatment for MRSA infections.      Coagulation Studies: No results for input(s): LABPROT, INR in the last 72 hours.  Urinalysis: No results for input(s): COLORURINE, LABSPEC, PHURINE, GLUCOSEU, HGBUR, BILIRUBINUR, KETONESUR, PROTEINUR, UROBILINOGEN, NITRITE, LEUKOCYTESUR in the last 72 hours.  Invalid input(s): APPERANCEUR      Imaging: Dg Chest 2 View  Result Date: 09/07/2016 CLINICAL DATA:  Shortness of breath for 1 year. Increased over the last 1 week. EXAM: CHEST  2 VIEW COMPARISON:  Two-view chest x-ray 07/25/2016 FINDINGS: The heart is enlarged. Atherosclerotic calcifications of the aorta are again noted. Previously noted right lower lobe airspace disease has cleared. Ill-defined right middle lobe opacities are present. The pulmonary arteries are prominent bilaterally. Degenerative changes are noted in the thoracic spine. IMPRESSION: 1. Ill-defined right middle lobe airspace disease reflecting atelectasis or early infection.  2. The left lung is clear. 3. Cardiomegaly without failure. 4. Aortic atherosclerosis. Electronically Signed   By: Marin Robertshristopher  Mattern M.D.   On: 09/07/2016 18:48   [place] Renal  Result Date: 09/08/2016 CLINICAL DATA:  83 year old male with acute renal failure EXAM: RENAL / URINARY TRACT ULTRASOUND COMPLETE COMPARISON:  Prior CT abdomen/ pelvis 07/15/2015 FINDINGS: Right Kidney: Length: 9.3 cm. Mildly echogenic renal parenchyma. No evidence of hydronephrosis or nephrolithiasis. Circumscribed anechoic simple cysts in the lower pole measure 1 cm. Left Kidney: Length: 8.7 cm. Mildly echogenic renal parenchyma. No evidence hydronephrosis or nephrolithiasis. No renal masses. Bladder: Appears normal for degree of bladder distention. Both ureteral jets are  identified. Other: Mild perihepatic and perisplenic ascites. At least moderate right-sided pleural effusion. The suprahepatic IVC also appears distended. The prostate gland is enlarged. IMPRESSION: 1. No evidence of hydronephrosis. 2. Mildly echogenic renal parenchyma suggests underlying medical renal disease. 3. At least moderate right-sided pleural effusion and mild upper abdominal ascites. 4. Distended suprahepatic inferior vena cava suggesting passive venous congestion in the setting of elevated right heart pressure. 5. Prostatomegaly. Electronically Signed   By: Malachy MoanHeath  McCullough M.D.   On: 09/08/2016 15:24      Assessment & Plan: Pt is a 83 y.o. yo male with  medical problems of BPH, Ch Sys CHF EF 15 %, CAD, HLD, HTN, who [facility] for evaluation of worsening SOB  1. ARF on CKD st 3 - likely secondary to cardiorenal syndrome 2. LE edema 3. Ch Systolic CHF  Patient has low normal BP He has total body fluid overload and LE edema Albumin is in the normal range  Recommendations: Agree with holding Losartan and anti-HTN meds to allow higher systolic BP and renal perfusion pressures Consider discussion with cardiology team about using ionotropic agents No sure if lasix is helping under current conditions of Low BP Electrolytes and Volume status are acceptable No acute indication for Dialysis at present

## 2016-09-08 NOTE — Progress Notes (Signed)
[facility] [place] [facility]   PATIENT NAME: Craig Gallegos    MR#:  [id number]  DATE OF BIRTH:  [birth year]  SUBJECTIVE:  CHIEF COMPLAINT:   Chief Complaint  Patient presents with  . Shortness of Breath   - feels short of breath, on 3L o20 not on home o2 - worsening real function, on IV lasix  REVIEW OF SYSTEMS:  Review of Systems  Constitutional: Positive for malaise/fatigue. Negative for chills and fever.  HENT: Negative for congestion, ear discharge, hearing loss and nosebleeds.   Eyes: Negative for blurred vision.  Respiratory: Positive for shortness of breath. Negative for cough and wheezing.   Cardiovascular: Positive for leg swelling. Negative for chest pain and palpitations.  Gastrointestinal: Negative for abdominal pain, constipation, diarrhea, nausea and vomiting.  Genitourinary: Negative for dysuria.  Musculoskeletal: Negative for myalgias.  Neurological: Negative for dizziness, speech change, focal weakness, seizures and headaches.  Psychiatric/Behavioral: Negative for depression.    DRUG ALLERGIES:   Allergies  Allergen Reactions  . Horse-Derived Products Hives    VITALS:  Blood pressure 108/70, pulse 60, temperature 98.6 F (37 C), temperature source Axillary, resp. rate 18, height 5\' 8"  (1.727 m), weight 77.1 kg (170 lb), SpO2 98 %.  PHYSICAL EXAMINATION:  Physical Exam  GENERAL:  83 y.o.-year-old patient lying in the bed with no acute distress.  EYES: Pupils equal, round, reactive to light and accommodation. No scleral icterus. Extraocular muscles intact.  HEENT: Head atraumatic, normocephalic. Oropharynx and nasopharynx clear.  NECK:  Supple, no jugular venous distention. No thyroid enlargement, no tenderness.  LUNGS: Normal breath sounds bilaterally, no wheezing, rales,rhonchi or crepitation. No use of accessory muscles of respiration. Decreased bibasilar breath sounds CARDIOVASCULAR: S1, S2 normal. No murmurs, rubs, or  gallops.  ABDOMEN: Soft, nontender, nondistended. Bowel sounds present. No organomegaly or mass.  EXTREMITIES: 3+ lower extremity edema with erythema over the feet. No warmth or tenderness noted, No cyanosis, or clubbing.  NEUROLOGIC: Cranial nerves II through XII are intact. Muscle strength 5/5 in all extremities. Sensation intact. Gait not checked.  PSYCHIATRIC: The patient is alert and oriented x 3.  SKIN: No obvious rash, lesion, or ulcer.    LABORATORY PANEL:   CBC  Recent Labs Lab 09/08/16 0513  WBC 6.9  HGB 13.2  HCT 40.2  PLT 95*   ------------------------------------------------------------------------------------------------------------------  Chemistries   Recent Labs Lab 09/08/16 0513  NA 142  K 4.8  CL 108  CO2 23  GLUCOSE 105*  BUN 105*  CREATININE 3.31*  CALCIUM 9.4   ------------------------------------------------------------------------------------------------------------------  Cardiac Enzymes  Recent Labs Lab 09/07/16 2031  TROPONINI 1.57*   ------------------------------------------------------------------------------------------------------------------  RADIOLOGY:  Dg Chest 2 View  Result Date: 09/07/2016 CLINICAL DATA:  Shortness of breath for 1 year. Increased over the last 1 week. EXAM: CHEST  2 VIEW COMPARISON:  Two-view chest x-ray 07/25/2016 FINDINGS: The heart is enlarged. Atherosclerotic calcifications of the aorta are again noted. Previously noted right lower lobe airspace disease has cleared. Ill-defined right middle lobe opacities are present. The pulmonary arteries are prominent bilaterally. Degenerative changes are noted in the thoracic spine. IMPRESSION: 1. Ill-defined right middle lobe airspace disease reflecting atelectasis or early infection. 2. The left lung is clear. 3. Cardiomegaly without failure. 4. Aortic atherosclerosis. Electronically Signed   By: Marin Robertshristopher  Mattern M.D.   On: 09/07/2016 18:48    EKG:   Orders  placed or performed during the hospital encounter of 09/07/16  . EKG 12-Lead  . EKG 12-Lead  .  ED EKG  . ED EKG    ASSESSMENT AND PLAN:   James Senn  is a 83 y.o. male with a known history of Congestive heart failure with systolic dysfunction, EF of 15% from 2 weeks ago, CK D stage IV, CAD status post angioplasty long time ago, hypertension and hyperlipidemia presents from home secondary to worsening shortness of breath.  #1 acute on chronic dyspnea-worsening shortness of breath. Could be from underlying worsening heart failure. -Acute on chronic systolic CHF exacerbation, known EF 15% -on IV Lasix at this time. BNP is significantly elevated -echocardiogram done as outpatient in November 2017 -on 3l o2- wean as tolerated, patient not on home o2 -CXR with cardiomegaly, no pulmonary edema, may be infiltrate- for now continue levaquin  #2 acute on chronic kidney disease-Baseline creatinine around 2 with GFR of 30. - creatinine is much worse at 3.3 today while on lasix - avoid nephrotoxins- On low-dose Lasix. Nephrology consulted -Renal ultrasound ordered. Check albumin  #3 GERD- protonix  #4 Elevated troponin- demand ischemia likely,   Denies any chest pain On asa, toprol, statin. Hold losartan due to ARF  #5 DVT Prophylaxis- SQ heparin  Patient independently ambulating.     All the records are reviewed and case discussed with Care Management/Social Workerr. Management plans discussed with the patient, family and they are in agreement.  CODE STATUS: DNR  TOTAL TIME TAKING CARE OF THIS PATIENT: 38 minutes.   POSSIBLE D/C IN 2 DAYS, DEPENDING ON CLINICAL CONDITION.   Enid Baas M.D on 09/08/2016 at 3:22 PM  Between 7am to 6pm - Pager - [phone]  After 6pm go to [email or web address] - Social research officer, government  [facility]  Office  [phone]  CC: Primary care physician; Lynnea Ferrier, MD

## 2016-09-08 NOTE — Progress Notes (Signed)
[facility]  Patient Status: Active  [facility] is providing the following services: SN & OT  If patient discharges after hours, please call [phone].   Craig CaseyJason E Gallegos 09/08/2016, 10:38 AM

## 2016-09-09 LAB — BLOOD CULTURE ID PANEL (REFLEXED)
ACINETOBACTER BAUMANNII: NOT DETECTED
CANDIDA GLABRATA: NOT DETECTED
CANDIDA KRUSEI: NOT DETECTED
Candida albicans: NOT DETECTED
Candida parapsilosis: NOT DETECTED
Candida tropicalis: NOT DETECTED
ENTEROBACTER CLOACAE COMPLEX: NOT DETECTED
ENTEROCOCCUS SPECIES: NOT DETECTED
ESCHERICHIA COLI: NOT DETECTED
Enterobacteriaceae species: NOT DETECTED
Haemophilus influenzae: NOT DETECTED
Klebsiella oxytoca: NOT DETECTED
Klebsiella pneumoniae: NOT DETECTED
LISTERIA MONOCYTOGENES: NOT DETECTED
Methicillin resistance: NOT DETECTED
Neisseria meningitidis: NOT DETECTED
PSEUDOMONAS AERUGINOSA: NOT DETECTED
Proteus species: NOT DETECTED
STREPTOCOCCUS AGALACTIAE: NOT DETECTED
STREPTOCOCCUS PNEUMONIAE: NOT DETECTED
STREPTOCOCCUS SPECIES: NOT DETECTED
Serratia marcescens: NOT DETECTED
Staphylococcus aureus (BCID): NOT DETECTED
Staphylococcus species: DETECTED — AB
Streptococcus pyogenes: NOT DETECTED

## 2016-09-09 LAB — BASIC METABOLIC PANEL
Anion gap: 11 (ref 5–15)
BUN: 102 mg/dL — ABNORMAL HIGH (ref 6–20)
CHLORIDE: 109 mmol/L (ref 101–111)
CO2: 25 mmol/L (ref 22–32)
Calcium: 9.5 mg/dL (ref 8.9–10.3)
Creatinine, Ser: 3.26 mg/dL — ABNORMAL HIGH (ref 0.61–1.24)
GFR calc Af Amer: 17 mL/min — ABNORMAL LOW (ref >60)
GFR calc non Af Amer: 15 mL/min — ABNORMAL LOW (ref >60)
Glucose, Bld: 108 mg/dL — ABNORMAL HIGH (ref 65–99)
POTASSIUM: 4.3 mmol/L (ref 3.5–5.1)
SODIUM: 145 mmol/L (ref 135–145)

## 2016-09-09 LAB — GLUCOSE, CAPILLARY: Glucose-Capillary: 147 mg/dL — ABNORMAL HIGH (ref 65–99)

## 2016-09-09 LAB — BLOOD GAS, VENOUS
ACID-BASE DEFICIT: 3.6 mmol/L — AB (ref 0.0–2.0)
Bicarbonate: 21.5 mmol/L (ref 20.0–28.0)
Patient temperature: 37
pCO2, Ven: 38 mmHg — ABNORMAL LOW (ref 44.0–60.0)
pH, Ven: 7.36 (ref 7.250–7.430)

## 2016-09-09 MED ORDER — DOBUTAMINE IN D5W 4-5 MG/ML-% IV SOLN
2.5000 ug/kg/min | INTRAVENOUS | Status: DC
  Administered 2016-09-09: 2.5 ug/kg/min via INTRAVENOUS
  Filled 2016-09-09: qty 250

## 2016-09-09 MED ORDER — CARVEDILOL 3.125 MG PO TABS
3.1250 mg | ORAL_TABLET | Freq: Two times a day (BID) | ORAL | Status: DC
  Administered 2016-09-09 – 2016-09-12 (×5): 3.125 mg via ORAL
  Filled 2016-09-09 (×6): qty 1

## 2016-09-09 NOTE — Progress Notes (Signed)
PHARMACY - PHYSICIAN COMMUNICATION CRITICAL VALUE ALERT - BLOOD CULTURE IDENTIFICATION (BCID)  Results for orders placed or performed during the hospital encounter of 09/07/16  Blood Culture ID Panel (Reflexed) (Collected: 09/07/2016  8:31 PM)  Result Value Ref Range   Enterococcus species NOT DETECTED NOT DETECTED   Listeria monocytogenes NOT DETECTED NOT DETECTED   Staphylococcus species DETECTED (A) NOT DETECTED   Staphylococcus aureus NOT DETECTED NOT DETECTED   Methicillin resistance NOT DETECTED NOT DETECTED   Streptococcus species NOT DETECTED NOT DETECTED   Streptococcus agalactiae NOT DETECTED NOT DETECTED   Streptococcus pneumoniae NOT DETECTED NOT DETECTED   Streptococcus pyogenes NOT DETECTED NOT DETECTED   Acinetobacter baumannii NOT DETECTED NOT DETECTED   Enterobacteriaceae species NOT DETECTED NOT DETECTED   Enterobacter cloacae complex NOT DETECTED NOT DETECTED   Escherichia coli NOT DETECTED NOT DETECTED   Klebsiella oxytoca NOT DETECTED NOT DETECTED   Klebsiella pneumoniae NOT DETECTED NOT DETECTED   Proteus species NOT DETECTED NOT DETECTED   Serratia marcescens NOT DETECTED NOT DETECTED   Haemophilus influenzae NOT DETECTED NOT DETECTED   Neisseria meningitidis NOT DETECTED NOT DETECTED   Pseudomonas aeruginosa NOT DETECTED NOT DETECTED   Candida albicans NOT DETECTED NOT DETECTED   Candida glabrata NOT DETECTED NOT DETECTED   Candida krusei NOT DETECTED NOT DETECTED   Candida parapsilosis NOT DETECTED NOT DETECTED   Candida tropicalis NOT DETECTED NOT DETECTED    Name of physician (or Provider) Contacted: Pyreddy  Changes to prescribed antibiotics required: none  Carola FrostNathan A Dmarco Baldus, Pharm.D., BCPS Clinical Pharmacist 09/09/2016  2:03 AM

## 2016-09-09 NOTE — Progress Notes (Signed)
Per MD Thedore MinsSingh, increase dobutamine by 2.5 every 1-2 hours, max dose of 10. To improve/increase urinary output. Will continue to monitor patient.

## 2016-09-09 NOTE — Consult Note (Signed)
[facility] Cardiology  CARDIOLOGY CONSULT NOTE  Patient ID: Craig Gallegos MRN: [id number] DOB/AGE: 05-Feb-1920 83 y.o.  Admit date: 09/07/2016 Referring Physician Allena Katz Primary Physician Wynonia Musty Cardiologist Gwen Pounds Reason for Consultation Acute on chronic systolic congestive heart failure  HPI: 83 year old male referred for acute on chronic systolic congestive heart failure. Patient has a history of systolic congestive heart failure with an EF of 15% by echo 07/2016, chronic kidney disease, CAD status post CABG >30 years ago, moderate aortic regurgitation, mild aortic stenosis, hyperlipidemia, and hypertension. Patient presented to the [facility] ER on 09/07/2016 for several days of worsening exertional shortness of breath and worsening lower extremity edema. Chest xray revealed atelectasis vs. Early infection of right middle lobe, without edema. He was treated with IV lasix. ECG revealed normal sinus rhythm with a LBBB and frequent PVCs, which was unchanged from previous ECG on 07/2016. Admission labs notable for elevated troponin at 1.57, creatinine 3.45, BUN 98. Patient was admitted for similar episode of heart failure exacerbation on 07/25/2016 with a troponin of 0.77. Presently, the patient is not requiring oxygen and states he feels better. He denies chest pain or shortness of breath, and has occasional palpitations.  Review of systems complete and found to be negative unless listed above     Past Medical History:  Diagnosis Date  . BPH (benign prostatic hyperplasia)   . CHF (congestive heart failure) (HCC)    EF 15% from recent ECHO in Nov 2017  . CKD (chronic kidney disease) stage 4, GFR 15-29 ml/min (HCC)   . Coronary artery disease    angioplasty > 30 years ago  . Hyperlipidemia   . Hypertension     Past Surgical History:  Procedure Laterality Date  . APPENDECTOMY    . CORONARY ANGIOPLASTY  1986    Prescriptions Prior to Admission  Medication Sig Dispense Refill Last Dose  .  acetaminophen (TYLENOL) 325 MG tablet Take 2 tablets (650 mg total) by mouth every 6 (six) hours as needed for mild pain (or Fever >/= 101). 30 tablet 0 prn at prn  . aspirin EC 325 MG tablet Take 325 mg by mouth daily.   unknown at unknown  . cholecalciferol (VITAMIN D) 1000 UNITS tablet Take 1,000 Units by mouth at bedtime.   unknown at unknown  . furosemide (LASIX) 40 MG tablet Take 40 mg by mouth daily.    unknown at unknown  . isosorbide mononitrate (IMDUR) 30 MG 24 hr tablet Take 30 mg by mouth daily.   unknown at unknown  . losartan (COZAAR) 100 MG tablet Take 100 mg by mouth daily.    unknown at unknown  . metoprolol succinate (TOPROL-XL) 25 MG 24 hr tablet Take 25 mg by mouth daily.   unknown at unknown  . Multiple Vitamins-Minerals (PRESERVISION AREDS 2 PO) Take 1 capsule by mouth 2 (two) times daily.    09/07/2016 at am  . nitroGLYCERIN (NITROSTAT) 0.4 MG SL tablet Place 0.4 mg under the tongue every 5 (five) minutes as needed for chest pain.    unknown at unknown  . omeprazole (PRILOSEC) 20 MG capsule Take 20 mg by mouth at bedtime.   09/06/2016 at unknown  . simvastatin (ZOCOR) 20 MG tablet Take 20 mg by mouth daily at 6 PM.   2 09/06/2016 at unknown   Social History   Social History  . Marital status: Widowed    Spouse name: N/A  . Number of children: N/A  . Years of education: N/A   Occupational  History  . Not on file.   Social History Main Topics  . Smoking status: Former Smoker    Types: Pipe  . Smokeless tobacco: Never Used  . Alcohol use 1.8 oz/week    3 Glasses of wine per week  . Drug use: No  . Sexual activity: Not on file   Other Topics Concern  . Not on file   Social History Narrative   Lives at home with wife, ambulates independently however limited due to his breathing    Family History  Problem Relation Age of Onset  . Heart failure Father   . Heart failure Brother       Review of systems complete and found to be negative unless listed above       PHYSICAL EXAM  General: In no acute distress HEENT:  Normocephalic and atramatic Neck:  No JVD.  Lungs: Clear bilaterally to auscultation. Normal effort of breathing. Heart: Bradycardic, irregular  Abdomen: Bowel sounds are positive, abdomen soft and non-tender  Msk:  Patient lying in bed, normal muscle tone for age Extremities: No clubbing, cyanosis. Pitting lower extremity edema.   Neuro: Alert and oriented X 3. Psych:  Good affect, responds appropriately  Labs:   Lab Results  Component Value Date   WBC 6.9 09/08/2016   HGB 13.2 09/08/2016   HCT 40.2 09/08/2016   MCV 80.4 09/08/2016   PLT 95 (L) 09/08/2016    Recent Labs Lab 09/09/16 0443  NA 145  K 4.3  CL 109  CO2 25  BUN 102*  CREATININE 3.26*  CALCIUM 9.5  GLUCOSE 108*   Lab Results  Component Value Date   TROPONINI 1.57 (HH) 09/07/2016   No results found for: CHOL No results found for: HDL No results found for: LDLCALC No results found for: TRIG No results found for: CHOLHDL No results found for: LDLDIRECT    Radiology: Dg Chest 2 View  Result Date: 09/07/2016 CLINICAL DATA:  Shortness of breath for 1 year. Increased over the last 1 week. EXAM: CHEST  2 VIEW COMPARISON:  Two-view chest x-ray 07/25/2016 FINDINGS: The heart is enlarged. Atherosclerotic calcifications of the aorta are again noted. Previously noted right lower lobe airspace disease has cleared. Ill-defined right middle lobe opacities are present. The pulmonary arteries are prominent bilaterally. Degenerative changes are noted in the thoracic spine. IMPRESSION: 1. Ill-defined right middle lobe airspace disease reflecting atelectasis or early infection. 2. The left lung is clear. 3. Cardiomegaly without failure. 4. Aortic atherosclerosis. Electronically Signed   By: Marin Roberts M.D.   On: 09/07/2016 18:48   US Renal  Result Date: 09/08/2016 CLINICAL DATA:  83 year old male with acute renal failure EXAM: RENAL / URINARY TRACT  ULTRASOUND COMPLETE COMPARISON:  Prior CT abdomen/ pelvis 07/15/2015 FINDINGS: Right Kidney: Length: 9.3 cm. Mildly echogenic renal parenchyma. No evidence of hydronephrosis or nephrolithiasis. Circumscribed anechoic simple cysts in the lower pole measure 1 cm. Left Kidney: Length: 8.7 cm. Mildly echogenic renal parenchyma. No evidence hydronephrosis or nephrolithiasis. No renal masses. Bladder: Appears normal for degree of bladder distention. Both ureteral jets are identified. Other: Mild perihepatic and perisplenic ascites. At least moderate right-sided pleural effusion. The suprahepatic IVC also appears distended. The prostate gland is enlarged. IMPRESSION: 1. No evidence of hydronephrosis. 2. Mildly echogenic renal parenchyma suggests underlying medical renal disease. 3. At least moderate right-sided pleural effusion and mild upper abdominal ascites. 4. Distended suprahepatic inferior vena cava suggesting passive venous congestion in the setting of elevated right heart pressure. 5.  Prostatomegaly. Electronically Signed   By: Malachy MoanHeath  McCullough M.D.   On: 09/08/2016 15:24    EKG: sinus with frequent PVCs, rate 59  ASSESSMENT AND PLAN:  1. Acute on chronic systolic congestive heart failure on dobutamine and improving clinically. 2. Elevated troponin, likely demand-supply ischemia, less likely ACS. 3. Acute renal failure   Recommendations:  1. Agree with current therapy 2. Maintain dobutamine at current dose and slowly titrate down for 24-48 hours 3. Defer cardiac cath. 4. Further recommendations pending patient's course.  Signed: Leanora Ivanoffnna Drane, PA-C 09/09/2016, 4:05 PM

## 2016-09-09 NOTE — Evaluation (Signed)
Physical Therapy Evaluation Patient Details Name: Craig Gallegos MRN: [id number] DOB: May 01, 1920 Today's Date: 09/09/2016   History of Present Illness  Craig Gallegos  is a 83 y.o. male with a known history of benign prostate hypertrophy, systolic congestive heart failure ejection fraction 15%, chronic kidney disease, coronary artery disease, hyperlipidemia, hypertension - lives in independent living facility and completely independent. For last few days he started feeling worsening in his shortness of breath in spite of taking all his medications on time. He denies any associated cough, chest pain, fever, chills. He noticed some leg swelling. In ER he was noted to have pulmonary edema on chest x-ray and acute systolic congestive heart failure. His renal function was noted slightly worse than his baseline and chest x-ray also showed some possibility of atelectasis versus pneumonia on the right lung.  Clinical Impression  Pt admitted with above diagnosis. Pt currently with functional limitations due to the deficits listed below (see PT Problem List). Pt is deconditioned and unsteady upon evaluation today. Pt requires minA+1 assist for transfers due to weakness and posterior loss of balance. He requires multiple attempts to come to standing with transfers due to imbalance and weakness. Pt ambulates with slow and labored gait out into hallway and back to bed. He demonstrates multiple stumbles during ambulation which require therapist assist to prevent him from falling. Poor reaction speed and poor safety awareness. Pt with DOE demonstrating increased respiration rate and fatigue during ambulation. However SaO2 remains at or above 94% on room air throughout session even with exertion. HR remains in the mid/upper 80's to low 90's bpm during ambulation. Continuous HR monitoring and intermittent monitoring of SaO2. This is a decline from patient's baseline with respect to balance and dyspnea on exertion. Pt is a  high fall risk and is at high risk for readmission due to his poor mobility and balance. He will need SNF placement at discharge in order to facilitate return to prior level of function. Pt is considering possible need for higher level of care at baseline such as assisted living or [facility] aids as well. To be discussed further at SNF as he progresses. Pt will benefit from skilled PT services to address deficits in strength, balance, and mobility in order to return to full function at home.      Follow Up Recommendations SNF    Equipment Recommendations  Other (comment) (TBD further at SNF. RW may be better than rollator for pt)    Recommendations for Other Services       Precautions / Restrictions Precautions Precautions: Fall Restrictions Weight Bearing Restrictions: No      Mobility  Bed Mobility Overal bed mobility: Needs Assistance Bed Mobility: Supine to Sit;Sit to Supine     Supine to sit: Supervision Sit to supine: Supervision   General bed mobility comments: Pt requires increased time for bed mobility. Performs slowly with HOB elevated and use of bed rails however no external assist required by therapist  Transfers Overall transfer level: Needs assistance Equipment used: Rolling walker (2 wheeled) Transfers: Sit to/from Stand Sit to Stand: Min assist         General transfer comment: Pt demonstrates poor speed and stability with transfers. He pushes with back of knees on EOB and demonstrates posterior falling requiring therapist assist to prevent him from going down onto bed. Pt requires 2 attempts to stand due to weakness and poor balance. Once upright he is able to steady himself with rolling walker.  Ambulation/Gait Ambulation/Gait assistance: Min  assist Ambulation Distance (Feet): 60 Feet Assistive device: Rolling walker (2 wheeled) Gait Pattern/deviations: Decreased step length - right;Decreased step length - left Gait velocity: Decreased Gait velocity  interpretation: <1.8 ft/sec, indicative of risk for recurrent falls General Gait Details: Pt ambulates with slow and labored gait out into hallway and back to bed. He demonstrates multiple stumbles and requires therapist assist to prevent him from falling multiple times. Poor reaction speed and poor safety awareness. Pt with DOE demonstrating increased respiration rate and fatigue. However SaO2 remains at or above 94% on room air throughout session even with exertion. HR remains in the mid/upper 80's to low 90's bpm during ambulation. Continuous HR monitoring and intermittent monitoring of SaO2. This appears to worse than his baseline with respect to balance and DOE  Information systems managertairs            Wheelchair Mobility    Modified Rankin (Stroke Patients Only)       Balance Overall balance assessment: Needs assistance Sitting-balance support: No upper extremity supported Sitting balance-Leahy Scale: Good     Standing balance support: No upper extremity supported Standing balance-Leahy Scale: Fair Standing balance comment: Able to maintain feet apart and feet together balance without UE support. Positive Rhomberg for severe sway but no complete LOB. Single leg balance approximately 1-2 seconds on each leg                             Pertinent Vitals/Pain Pain Assessment: No/denies pain    Home Living Family/patient expects to be discharged to:: Other (Comment) ([facility] Living Arrangements: Alone Available Help at Discharge: Friend(s);Available PRN/intermittently Type of Home: Independent living facility [facility] Home Access: Level entry     Home Layout: One level Home Equipment: Cane - single point;Grab bars - tub/shower;Walker - 4 wheels;Shower seat      Prior Function Level of Independence: Independent with assistive device(s)         Comments: Pt reports independence with aDLs/IADLs. States that he has been using a rollator recently to  assist with ambulation     Hand Dominance   Dominant Hand: Left    Extremity/Trunk Assessment   Upper Extremity Assessment Upper Extremity Assessment: Generalized weakness    Lower Extremity Assessment Lower Extremity Assessment: Generalized weakness       Communication   Communication: No difficulties  Cognition Arousal/Alertness: Awake/alert Behavior During Therapy: WFL for tasks assessed/performed Overall Cognitive Status: Within Functional Limits for tasks assessed                      General Comments      Exercises     Assessment/Plan    PT Assessment Patient needs continued PT services  PT Problem List Decreased strength;Decreased activity tolerance;Decreased balance;Decreased safety awareness;Cardiopulmonary status limiting activity          PT Treatment Interventions DME instruction;Gait training;Functional mobility training;Therapeutic activities;Therapeutic exercise;Balance training;Neuromuscular re-education;Patient/family education    PT Goals (Current goals can be found in the Care Plan section)  Acute Rehab PT Goals Patient Stated Goal: Return to prior level of function. Pt is considering need for higher level care or in home aids PT Goal Formulation: With patient Time For Goal Achievement: 09/23/16 Potential to Achieve Goals: Good    Frequency Min 2X/week   Barriers to discharge Decreased caregiver support [facility] so has no assist in home    Co-evaluation  End of Session Equipment Utilized During Treatment: Gait belt Activity Tolerance: Patient limited by fatigue Patient left: in bed;with call bell/phone within reach;with bed alarm set Nurse Communication: Mobility status;Other (comment) (Vitals, requests orange juice, needs new chux pad)         Time: 1610-9604 PT Time Calculation (min) (ACUTE ONLY): 26 min   Charges:   PT Evaluation $PT Eval Low Complexity: 1  Procedure PT Treatments $Gait Training: 8-22 mins   PT G Codes:       Sharalyn Ink Huprich PT, DPT   Huprich,Jason 09/09/2016, 10:02 AM

## 2016-09-09 NOTE — [place] FL2 (Signed)
[place] MEDICAID FL2 LEVEL OF CARE SCREENING TOOL     IDENTIFICATION  Patient Name: Craig Gallegos Birthdate: 24-Nov-1919 Sex: male Admission Date (Current Location): 09/07/2016  [place]   Facility and Address:  [facility], [address], [place], [place] [address]      Provider Number: [id number]  Attending Physician Name and Address:  Enedina FinnerSona Patel, MD  Relative Name and Phone Number:       Current Level of Care: Hospital Recommended Level of Care: Skilled Nursing Facility Prior Approval Number:    Date Approved/Denied:   PASRR Number: [id number]  Discharge Plan: SNF    Current Diagnoses: Patient Active Problem List   Diagnosis Date Noted  . Acute on chronic systolic CHF (congestive heart failure) (HCC) 09/07/2016  . Acute CHF (HCC) 09/07/2016  . Bacteremia 07/17/2015  . Chest pain 07/15/2015  . NSTEMI (non-ST elevated myocardial infarction) (HCC) 07/15/2015  . Chronic systolic heart failure (HCC) 06/12/2015  . Essential hypertension 06/12/2015    Orientation RESPIRATION BLADDER Height & Weight     Self, Time, Situation, Place  Normal Continent Weight: 166 lb 14.2 oz (75.7 kg) Height:  5\' 8"  (172.7 cm)  BEHAVIORAL SYMPTOMS/MOOD NEUROLOGICAL BOWEL NUTRITION STATUS   (none)  (none) Continent Diet (heart healthy)  AMBULATORY STATUS COMMUNICATION OF NEEDS Skin   Extensive Assist Verbally Normal                       Personal Care Assistance Level of Assistance  Bathing, Dressing Bathing Assistance: Limited assistance   Dressing Assistance: Limited assistance     Functional Limitations Info  Hearing   Hearing Info: Impaired      SPECIAL CARE FACTORS FREQUENCY  PT (By licensed PT)                    Contractures Contractures Info: Not present    Additional Factors Info  Code Status, Allergies Code Status Info: dnr Allergies Info: horse derived products           Current  Medications (09/09/2016):  This is the current hospital active medication list Current Facility-Administered Medications  Medication Dose Route Frequency Provider Last Rate Last Dose  . aspirin EC tablet 325 mg  325 mg Oral Daily Altamese DillingVaibhavkumar Vachhani, MD   325 mg at 09/09/16 0806  . carvedilol (COREG) tablet 3.125 mg  3.125 mg Oral BID WC Enedina FinnerSona Patel, MD      . cholecalciferol (VITAMIN D) tablet 1,000 Units  1,000 Units Oral QHS Altamese DillingVaibhavkumar Vachhani, MD   1,000 Units at 09/08/16 2122  . DOBUTamine (DOBUTREX) infusion 4000 mcg/mL  2.5-20 mcg/kg/min Intravenous Titrated Enedina FinnerSona Patel, MD 5.8 mL/hr at 09/09/16 1337 5 mcg/kg/min at 09/09/16 1337  . heparin injection 5,000 Units  5,000 Units Subcutaneous Q8H Altamese DillingVaibhavkumar Vachhani, MD   5,000 Units at 09/09/16 1339  . MEDLINE mouth rinse  15 mL Mouth Rinse BID Altamese DillingVaibhavkumar Vachhani, MD   15 mL at 09/09/16 0807  . multivitamin-lutein (OCUVITE-LUTEIN) capsule 1 capsule  1 capsule Oral BID Altamese DillingVaibhavkumar Vachhani, MD   1 capsule at 09/09/16 0806  . pantoprazole (PROTONIX) EC tablet 40 mg  40 mg Oral QAC breakfast Altamese DillingVaibhavkumar Vachhani, MD   40 mg at 09/09/16 0806  . simvastatin (ZOCOR) tablet 20 mg  20 mg Oral q1800 Altamese DillingVaibhavkumar Vachhani, MD   20 mg at 09/08/16 1701  . sodium chloride flush (NS) 0.9 % injection 3 mL  3 mL Intravenous  Q12H Altamese Dilling, MD   3 mL at 09/09/16 0806     Discharge Medications: Please see discharge summary for a list of discharge medications.  Relevant Imaging Results:  Relevant Lab Results:   Additional Information ss: [id number]  York Spaniel, LCSW

## 2016-09-09 NOTE — Progress Notes (Signed)
Dr Allena KatzPatel made aware that CCMD reported that pt having clusters of PVC and Bijeminy PVCs that are now more frequent than previously, acknowledged, no new orders

## 2016-09-09 NOTE — Progress Notes (Signed)
Pt being transferred to stepdown for dobutamine drip, report given to Ladona Ridgelaylor, pt family notified of transfer, pt with no complaints, no distress or discomfort noted

## 2016-09-09 NOTE — Progress Notes (Signed)
Notified MD Paraschos that patient had 22 beat run of V. Tach since startign dobutamine drip. MD stated to leave dobutamine at 5 and if patient continues to have runs of V. Tach to slowly titrate down on dobutamine until patient can tolerate. Will continue to monitor.

## 2016-09-09 NOTE — Progress Notes (Addendum)
[employer] - [place] at [facility]   PATIENT NAME: Craig Gallegos    MR#:  [id number]  DATE OF BIRTH:  February 27, 1920  SUBJECTIVE:   - improved short of breath, on 1L o2, - not on home o2 - worsening renal function, on IV lasix -leg edema+  REVIEW OF SYSTEMS:  Review of Systems  Constitutional: Positive for malaise/fatigue. Negative for chills and fever.  HENT: Negative for congestion, ear discharge, hearing loss and nosebleeds.   Eyes: Negative for blurred vision.  Respiratory: Positive for shortness of breath. Negative for cough and wheezing.   Cardiovascular: Positive for leg swelling. Negative for chest pain and palpitations.  Gastrointestinal: Negative for abdominal pain, constipation, diarrhea, nausea and vomiting.  Genitourinary: Negative for dysuria.  Musculoskeletal: Negative for myalgias.  Neurological: Negative for dizziness, speech change, focal weakness, seizures and headaches.  Psychiatric/Behavioral: Negative for depression.    DRUG ALLERGIES:   Allergies  Allergen Reactions  . Horse-Derived Products Hives    VITALS:  Blood pressure 106/71, pulse 62, temperature 97.4 F (36.3 C), temperature source Oral, resp. rate 18, height 5\' 8"  (1.727 m), weight 77.1 kg (170 lb), SpO2 100 %.  PHYSICAL EXAMINATION:  Physical Exam  GENERAL:  83 y.o.-year-old patient lying in the bed with no acute distress.  EYES: Pupils equal, round, reactive to light and accommodation. No scleral icterus. Extraocular muscles intact.  HEENT: Head atraumatic, normocephalic. Oropharynx and nasopharynx clear.  NECK:  Supple, no jugular venous distention. No thyroid enlargement, no tenderness.  LUNGS: Normal breath sounds bilaterally, no wheezing, rales,rhonchi or crepitation. No use of accessory muscles of respiration. Decreased bibasilar breath sounds CARDIOVASCULAR: S1, S2 normal. No murmurs, rubs, or gallops.  ABDOMEN: Soft, nontender, nondistended. Bowel sounds  present. No organomegaly or mass.  EXTREMITIES: 3+ lower extremity edema with erythema over the feet. No warmth or tenderness noted, No cyanosis, or clubbing.  NEUROLOGIC: Cranial nerves II through XII are intact. Muscle strength 5/5 in all extremities. Sensation intact. Gait not checked.  PSYCHIATRIC: The patient is alert and oriented x 3.  SKIN: No obvious rash, lesion, or ulcer.    LABORATORY PANEL:   CBC  Recent Labs Lab 09/08/16 0513  WBC 6.9  HGB 13.2  HCT 40.2  PLT 95*   ------------------------------------------------------------------------------------------------------------------  Chemistries   Recent Labs Lab 09/09/16 0443  NA 145  K 4.3  CL 109  CO2 25  GLUCOSE 108*  BUN 102*  CREATININE 3.26*  CALCIUM 9.5   ------------------------------------------------------------------------------------------------------------------  Cardiac Enzymes  Recent Labs Lab 09/07/16 2031  TROPONINI 1.57*   ------------------------------------------------------------------------------------------------------------------  RADIOLOGY:  Dg Chest 2 View  Result Date: 09/07/2016 CLINICAL DATA:  Shortness of breath for 1 year. Increased over the last 1 week. EXAM: CHEST  2 VIEW COMPARISON:  Two-view chest x-ray 07/25/2016 FINDINGS: The heart is enlarged. Atherosclerotic calcifications of the aorta are again noted. Previously noted right lower lobe airspace disease has cleared. Ill-defined right middle lobe opacities are present. The pulmonary arteries are prominent bilaterally. Degenerative changes are noted in the thoracic spine. IMPRESSION: 1. Ill-defined right middle lobe airspace disease reflecting atelectasis or early infection. 2. The left lung is clear. 3. Cardiomegaly without failure. 4. Aortic atherosclerosis. Electronically Signed   By: Marin Robertshristopher  Mattern M.D.   On: 09/07/2016 18:48   [place] Renal  Result Date: 09/08/2016 CLINICAL DATA:  83 year old male with acute renal  failure EXAM: RENAL / URINARY TRACT ULTRASOUND COMPLETE COMPARISON:  Prior CT abdomen/ pelvis 07/15/2015 FINDINGS: Right Kidney: Length: 9.3 cm.  Mildly echogenic renal parenchyma. No evidence of hydronephrosis or nephrolithiasis. Circumscribed anechoic simple cysts in the lower pole measure 1 cm. Left Kidney: Length: 8.7 cm. Mildly echogenic renal parenchyma. No evidence hydronephrosis or nephrolithiasis. No renal masses. Bladder: Appears normal for degree of bladder distention. Both ureteral jets are identified. Other: Mild perihepatic and perisplenic ascites. At least moderate right-sided pleural effusion. The suprahepatic IVC also appears distended. The prostate gland is enlarged. IMPRESSION: 1. No evidence of hydronephrosis. 2. Mildly echogenic renal parenchyma suggests underlying medical renal disease. 3. At least moderate right-sided pleural effusion and mild upper abdominal ascites. 4. Distended suprahepatic inferior vena cava suggesting passive venous congestion in the setting of elevated right heart pressure. 5. Prostatomegaly. Electronically Signed   By: Malachy Moan M.D.   On: 09/08/2016 15:24    EKG:   Orders placed or performed during the hospital encounter of 09/07/16  . EKG 12-Lead  . EKG 12-Lead  . ED EKG  . ED EKG    ASSESSMENT AND PLAN:   Craig Gallegos  is a 83 y.o. male with a known history of Congestive heart failure with systolic dysfunction, EF of 15% from 2 weeks ago, CK D stage IV, CAD status post angioplasty long time ago, hypertension and hyperlipidemia presents from home secondary to worsening shortness of breath.  #1 acute on chronic dyspnea-worsening shortness of breath. Could be from underlying worsening heart failure. -Acute on chronic systolic CHF exacerbation, known EF 15% -on IV Lasix at this time. BNP is significantly elevated--but worsening creatinine -echocardiogram done as outpatient in November 2017 -on 1 l o2- wean as tolerated, patient not on home  o2 -CXR with cardiomegaly, no pulmonary edema -no s/o pneumonia. D/c levaquin -d/w Nephrology will transfer to ICU for dobutamine gtt to see if output improves and monitor creat  #2 acute on chronic kidney disease-Baseline creatinine around 2 with GFR of 30. - creatinine is much worse at 3.3 today while on lasix---holding lasix - avoid nephrotoxins -  Nephrology consulted appreciate input -Renal ultrasound noted-shows medical renal dz  #3 GERD- protonix  #4 Elevated troponin- demand ischemia likely,   Denies any chest pain On asa, toprol, statin. Hold losartan due to ARF  #5 DVT Prophylaxis- SQ heparin  PT to see. Pt will benefit from Rehab given his severe CMP/CHF/weakness/worsening CKD   All the records are reviewed and case discussed with Care Management/Social Workerr. Management plans discussed with the patient, family and they are in agreement.  CODE STATUS: DNR  TOTAL critical TIME TAKING CARE OF THIS PATIENT:40 minutes.   POSSIBLE D/C IN 2-3 DAYS, DEPENDING ON CLINICAL CONDITION.   Hadas Jessop M.D on 09/09/2016 at 9:57 AM  Between 7am to 6pm - Pager - [phone]  After 6pm go to [email or web address] - Social research officer, government  [facility] [place] [facility]  Office  [phone]  CC: Primary care physician; Lynnea Ferrier, MD

## 2016-09-09 NOTE — Care Management (Signed)
Patient may be from [facility] independent housing however [facility] is [facility] and physical therapy.

## 2016-09-09 NOTE — Progress Notes (Signed)
Spoke with MD Thedore MinsSingh and notified him that patient was having frequent runs of V. Tach, even with titrating dobutamine drip down to 3. Currently had drip on hold and patient was continuing to have runs of V. Tach. MD Thedore MinsSingh stated to leave dobutamine drip off overnight and he would evaluate patient tomorrow. Will continue to monitor patient.

## 2016-09-09 NOTE — Progress Notes (Signed)
Subjective Patient was transferred to ICU for dobutamine infusion in an effort to increase cardiac output        Current medications: Current Facility-Administered Medications  Medication Dose Route Frequency Provider Last Rate Last Dose  . aspirin EC tablet 325 mg  325 mg Oral Daily Altamese Dilling, MD   325 mg at 09/09/16 0806  . carvedilol (COREG) tablet 3.125 mg  3.125 mg Oral BID WC Enedina Finner, MD      . cholecalciferol (VITAMIN D) tablet 1,000 Units  1,000 Units Oral QHS Altamese Dilling, MD   1,000 Units at 09/08/16 2122  . DOBUTamine (DOBUTREX) infusion 4000 mcg/mL  2.5-20 mcg/kg/min Intravenous Titrated Enedina Finner, MD 5.8 mL/hr at 09/09/16 1337 5 mcg/kg/min at 09/09/16 1337  . heparin injection 5,000 Units  5,000 Units Subcutaneous Q8H Altamese Dilling, MD   5,000 Units at 09/09/16 1339  . MEDLINE mouth rinse  15 mL Mouth Rinse BID Altamese Dilling, MD   15 mL at 09/09/16 0807  . multivitamin-lutein (OCUVITE-LUTEIN) capsule 1 capsule  1 capsule Oral BID Altamese Dilling, MD   1 capsule at 09/09/16 0806  . pantoprazole (PROTONIX) EC tablet 40 mg  40 mg Oral QAC breakfast Altamese Dilling, MD   40 mg at 09/09/16 0806  . simvastatin (ZOCOR) tablet 20 mg  20 mg Oral q1800 Altamese Dilling, MD   20 mg at 09/08/16 1701  . sodium chloride flush (NS) 0.9 % injection 3 mL  3 mL Intravenous Q12H Altamese Dilling, MD   3 mL at 09/09/16 0806      Vital Signs: Blood pressure 111/75, pulse 78, temperature 97.7 F (36.5 C), temperature source Axillary, resp. rate (!) 22, height 5\' 8"  (1.727 m), weight 75.7 kg (166 lb 14.2 oz), SpO2 96 %.   Intake/Output Summary (Last 24 hours) at 09/09/16 1412 Last data filed at 09/09/16 1337  Gross per 24 hour  Intake           245.17 ml  Output             1050 ml  Net          -804.83 ml    Weight trends: Filed Weights   09/07/16 1629 09/09/16 1200  Weight: 77.1 kg (170 lb) 75.7 kg (166 lb 14.2 oz)     Physical Exam: General:  NAD, laying in bed  HEENT Anicteric, moist oral mucus membranes  Neck:  supple  Lungs: Clear b/l, room air  Heart::  irregular, tachycardic  Abdomen: Soft, NT  Extremities:  ++ dependent leg edema  Neurologic: Alert, oreinted  Skin: Redness over legs, dry skin             Lab results: Basic Metabolic Panel:  Recent Labs Lab 09/07/16 1633 09/08/16 0513 09/09/16 0443  NA 143 142 145  K 5.4* 4.8 4.3  CL 107 108 109  CO2 21* 23 25  GLUCOSE 119* 105* 108*  BUN 98* 105* 102*  CREATININE 3.45* 3.31* 3.26*  CALCIUM 9.8 9.4 9.5    Liver Function Tests:  Recent Labs Lab 09/08/16 0513  ALBUMIN 3.6   No results for input(s): LIPASE, AMYLASE in the last 168 hours. No results for input(s): AMMONIA in the last 168 hours.  CBC:  Recent Labs Lab 09/07/16 1633 09/08/16 0513  WBC 9.5 6.9  HGB 14.4 13.2  HCT 44.1 40.2  MCV 81.4 80.4  PLT 120* 95*    Cardiac Enzymes:  Recent Labs Lab 09/07/16 2031  TROPONINI 1.57*  BNP: Invalid input(s): POCBNP  CBG:  Recent Labs Lab 09/09/16 1153  GLUCAP 147*    Microbiology: Recent Results (from the past 720 hour(s))  Blood culture (routine x 2)     Status: None (Preliminary result)   Collection Time: 09/07/16  8:31 PM  Result Value Ref Range Status   Specimen Description BLOOD RIGHT ARM  Final   Special Requests   Final    BOTTLES DRAWN AEROBIC AND ANAEROBIC AER10CC AER11CC   Culture  Setup Time   Final    Organism ID to follow GRAM POSITIVE COCCI CRITICAL RESULT CALLED TO, READ BACK BY AND VERIFIED WITH: NATE COOKSON ON 09/09/16 AT 0125 BY TLB ANAEROBIC BOTTLE ONLY    Culture GRAM POSITIVE COCCI  Final   Report Status PENDING  Incomplete  Blood culture (routine x 2)     Status: None (Preliminary result)   Collection Time: 09/07/16  8:31 PM  Result Value Ref Range Status   Specimen Description BLOOD LEFT AC  Final   Special Requests   Final    BOTTLES DRAWN AEROBIC AND  ANAEROBIC ANA12CC AER10CC   Culture NO GROWTH 2 DAYS  Final   Report Status PENDING  Incomplete  Rapid Influenza A&B Antigens ([facility] only)     Status: None   Collection Time: 09/07/16  8:31 PM  Result Value Ref Range Status   Influenza A ([facility]) NEGATIVE NEGATIVE Final   Influenza B ([facility]) NEGATIVE NEGATIVE Final  Blood Culture ID Panel (Reflexed)     Status: Abnormal   Collection Time: 09/07/16  8:31 PM  Result Value Ref Range Status   Enterococcus species NOT DETECTED NOT DETECTED Final   Listeria monocytogenes NOT DETECTED NOT DETECTED Final   Staphylococcus species DETECTED (A) NOT DETECTED Final    Comment: NATE COOKSON ON 09/09/16 AT 0125 BY TLB   Staphylococcus aureus NOT DETECTED NOT DETECTED Final   Methicillin resistance NOT DETECTED NOT DETECTED Final   Streptococcus species NOT DETECTED NOT DETECTED Final   Streptococcus agalactiae NOT DETECTED NOT DETECTED Final   Streptococcus pneumoniae NOT DETECTED NOT DETECTED Final   Streptococcus pyogenes NOT DETECTED NOT DETECTED Final   Acinetobacter baumannii NOT DETECTED NOT DETECTED Final   Enterobacteriaceae species NOT DETECTED NOT DETECTED Final   Enterobacter cloacae complex NOT DETECTED NOT DETECTED Final   Escherichia coli NOT DETECTED NOT DETECTED Final   Klebsiella oxytoca NOT DETECTED NOT DETECTED Final   Klebsiella pneumoniae NOT DETECTED NOT DETECTED Final   Proteus species NOT DETECTED NOT DETECTED Final   Serratia marcescens NOT DETECTED NOT DETECTED Final   Haemophilus influenzae NOT DETECTED NOT DETECTED Final   Neisseria meningitidis NOT DETECTED NOT DETECTED Final   Pseudomonas aeruginosa NOT DETECTED NOT DETECTED Final   Candida albicans NOT DETECTED NOT DETECTED Final   Candida glabrata NOT DETECTED NOT DETECTED Final   Candida krusei NOT DETECTED NOT DETECTED Final   Candida parapsilosis NOT DETECTED NOT DETECTED Final   Candida tropicalis NOT DETECTED NOT DETECTED Final  MRSA PCR Screening     Status: None    Collection Time: 09/08/16  2:15 AM  Result Value Ref Range Status   MRSA by PCR NEGATIVE NEGATIVE Final    Comment:        The GeneXpert MRSA Assay (FDA approved for NASAL specimens only), is one component of a comprehensive MRSA colonization surveillance program. It is not intended to diagnose MRSA infection nor to guide or monitor treatment for MRSA infections.  Coagulation Studies: No results for input(s): LABPROT, INR in the last 72 hours.  Urinalysis: No results for input(s): COLORURINE, LABSPEC, PHURINE, GLUCOSEU, HGBUR, BILIRUBINUR, KETONESUR, PROTEINUR, UROBILINOGEN, NITRITE, LEUKOCYTESUR in the last 72 hours.  Invalid input(s): APPERANCEUR      Imaging: Dg Chest 2 View  Result Date: 09/07/2016 CLINICAL DATA:  Shortness of breath for 1 year. Increased over the last 1 week. EXAM: CHEST  2 VIEW COMPARISON:  Two-view chest x-ray 07/25/2016 FINDINGS: The heart is enlarged. Atherosclerotic calcifications of the aorta are again noted. Previously noted right lower lobe airspace disease has cleared. Ill-defined right middle lobe opacities are present. The pulmonary arteries are prominent bilaterally. Degenerative changes are noted in the thoracic spine. IMPRESSION: 1. Ill-defined right middle lobe airspace disease reflecting atelectasis or early infection. 2. The left lung is clear. 3. Cardiomegaly without failure. 4. Aortic atherosclerosis. Electronically Signed   By: Marin Roberts M.D.   On: 09/07/2016 18:48   US Renal  Result Date: 09/08/2016 CLINICAL DATA:  83 year old male with acute renal failure EXAM: RENAL / URINARY TRACT ULTRASOUND COMPLETE COMPARISON:  Prior CT abdomen/ pelvis 07/15/2015 FINDINGS: Right Kidney: Length: 9.3 cm. Mildly echogenic renal parenchyma. No evidence of hydronephrosis or nephrolithiasis. Circumscribed anechoic simple cysts in the lower pole measure 1 cm. Left Kidney: Length: 8.7 cm. Mildly echogenic renal parenchyma. No evidence  hydronephrosis or nephrolithiasis. No renal masses. Bladder: Appears normal for degree of bladder distention. Both ureteral jets are identified. Other: Mild perihepatic and perisplenic ascites. At least moderate right-sided pleural effusion. The suprahepatic IVC also appears distended. The prostate gland is enlarged. IMPRESSION: 1. No evidence of hydronephrosis. 2. Mildly echogenic renal parenchyma suggests underlying medical renal disease. 3. At least moderate right-sided pleural effusion and mild upper abdominal ascites. 4. Distended suprahepatic inferior vena cava suggesting passive venous congestion in the setting of elevated right heart pressure. 5. Prostatomegaly. Electronically Signed   By: Malachy Moan M.D.   On: 09/08/2016 15:24     Assessment & Plan: Pt is a 83 y.o. yo male with  medical problems of BPH, Ch Sys CHF EF 15 %, CAD, HLD, HTN, who was admitted to [facility] on 09/07/2016 for evaluation of worsening SOB  1. ARF on CKD st 3 - likely secondary to cardiorenal syndrome 2. LE edema 3. Ch Systolic CHF  Patient has low normal BP He has total body fluid overload and LE edema Albumin is in the normal range  Recommendations: Agree with holding Losartan and anti-HTN meds to allow higher systolic BP and renal perfusion pressures Agree with iv dobutamine Titrate up to a moderate dose of 10 mcg and assess response.  Electrolytes and Volume status are acceptable No acute indication for Dialysis at present

## 2016-09-10 LAB — CBC
HEMATOCRIT: 38 % — AB (ref 40.0–52.0)
HEMOGLOBIN: 12.4 g/dL — AB (ref 13.0–18.0)
MCH: 26.5 pg (ref 26.0–34.0)
MCHC: 32.5 g/dL (ref 32.0–36.0)
MCV: 81.4 fL (ref 80.0–100.0)
Platelets: 89 10*3/uL — ABNORMAL LOW (ref 150–440)
RBC: 4.66 MIL/uL (ref 4.40–5.90)
RDW: 17.7 % — ABNORMAL HIGH (ref 11.5–14.5)
WBC: 5 10*3/uL (ref 3.8–10.6)

## 2016-09-10 LAB — BASIC METABOLIC PANEL
Anion gap: 8 (ref 5–15)
BUN: 97 mg/dL — AB (ref 6–20)
CHLORIDE: 109 mmol/L (ref 101–111)
CO2: 26 mmol/L (ref 22–32)
CREATININE: 2.99 mg/dL — AB (ref 0.61–1.24)
Calcium: 9 mg/dL (ref 8.9–10.3)
GFR calc Af Amer: 19 mL/min — ABNORMAL LOW (ref >60)
GFR calc non Af Amer: 17 mL/min — ABNORMAL LOW (ref >60)
Glucose, Bld: 106 mg/dL — ABNORMAL HIGH (ref 65–99)
POTASSIUM: 4.2 mmol/L (ref 3.5–5.1)
Sodium: 143 mmol/L (ref 135–145)

## 2016-09-10 LAB — PROCALCITONIN: Procalcitonin: 0.12 ng/mL

## 2016-09-10 NOTE — Progress Notes (Signed)
[employer] - [place] at [facility]   PATIENT NAME: Craig Gallegos    MR#:  [id number]  DATE OF BIRTH:  September 05, 1920  SUBJECTIVE:   -appears weak but overall feels better  REVIEW OF SYSTEMS:  Review of Systems  Constitutional: Positive for malaise/fatigue. Negative for chills and fever.  HENT: Negative for congestion, ear discharge, hearing loss and nosebleeds.   Eyes: Negative for blurred vision.  Respiratory: Positive for shortness of breath. Negative for cough and wheezing.   Cardiovascular: Positive for leg swelling. Negative for chest pain and palpitations.  Gastrointestinal: Negative for abdominal pain, constipation, diarrhea, nausea and vomiting.  Genitourinary: Negative for dysuria.  Musculoskeletal: Negative for myalgias.  Neurological: Negative for dizziness, speech change, focal weakness, seizures and headaches.  Psychiatric/Behavioral: Negative for depression.    DRUG ALLERGIES:   Allergies  Allergen Reactions  . Horse-Derived Products Hives    VITALS:  Blood pressure (!) 100/57, pulse 79, temperature 97.5 F (36.4 C), temperature source Axillary, resp. rate 15, height 5\' 8"  (1.727 m), weight 75.7 kg (166 lb 14.2 oz), SpO2 98 %.  PHYSICAL EXAMINATION:  Physical Exam  GENERAL:  83 y.o.-year-old patient lying in the bed with no acute distress.  EYES: Pupils equal, round, reactive to light and accommodation. No scleral icterus. Extraocular muscles intact.  HEENT: Head atraumatic, normocephalic. Oropharynx and nasopharynx clear.  NECK:  Supple, no jugular venous distention. No thyroid enlargement, no tenderness.  LUNGS: Normal breath sounds bilaterally, no wheezing, rales,rhonchi or crepitation. No use of accessory muscles of respiration. Decreased bibasilar breath sounds CARDIOVASCULAR: S1, S2 normal. No murmurs, rubs, or gallops.  ABDOMEN: Soft, nontender, nondistended. Bowel sounds present. No organomegaly or mass.  EXTREMITIES: 3+ lower  extremity edema with erythema over the feet. No warmth or tenderness noted, No cyanosis, or clubbing.  NEUROLOGIC: Cranial nerves II through XII are intact. Muscle strength 5/5 in all extremities. Sensation intact. Gait not checked.  PSYCHIATRIC: The patient is alert and oriented x 3.  SKIN: No obvious rash, lesion, or ulcer.    LABORATORY PANEL:   CBC  Recent Labs Lab 09/10/16 0439  WBC 5.0  HGB 12.4*  HCT 38.0*  PLT 89*   ------------------------------------------------------------------------------------------------------------------  Chemistries   Recent Labs Lab 09/10/16 1058  NA 143  K 4.2  CL 109  CO2 26  GLUCOSE 106*  BUN 97*  CREATININE 2.99*  CALCIUM 9.0   ------------------------------------------------------------------------------------------------------------------  Cardiac Enzymes  Recent Labs Lab 09/07/16 2031  TROPONINI 1.57*   ------------------------------------------------------------------------------------------------------------------  RADIOLOGY:  No results found.  EKG:   Orders placed or performed during the hospital encounter of 09/07/16  . EKG 12-Lead  . EKG 12-Lead  . ED EKG  . ED EKG    ASSESSMENT AND PLAN:   Craig Gallegos  is a 83 y.o. male with a known history of Congestive heart failure with systolic dysfunction, EF of 15% from 2 weeks ago, CK D stage IV, CAD status post angioplasty long time ago, hypertension and hyperlipidemia presents from home secondary to worsening shortness of breath.  #1 acute on chronic dyspnea-worsening shortness of breath. Could be from underlying worsening heart failure. -Acute on chronic systolic CHF exacerbation, known EF 15% -now off DObutamine gtt -BNP is significantly elevated--but worsening creatinine -echocardiogram done as outpatient in November 2017 -on 1 l o2- wean as tolerated, patient not on home o2 -CXR with cardiomegaly, no pulmonary edema -no s/o pneumonia. D/c  levaquin -d/w Nephrology   #2 acute on chronic kidney disease-Baseline creatinine around  2 with GFR of 30. - creatinine is much worse at 3.3 today while on lasix---holding lasix - avoid nephrotoxins -  Nephrology consulted appreciate input -Renal ultrasound noted-shows medical renal dz  #3 PVC's, intermittent NSVT -now off dobutamine gtt  #4 Elevated troponin- demand ischemia likely,   Denies any chest pain On asa, toprol, statin. Hold losartan due to ARF  #5 DVT Prophylaxis- SQ heparin  PT to see. Pt will benefit from Rehab given his severe CMP/CHF/weakness/worsening CKD  Transfer to 2A All the records are reviewed and case discussed with Care Management/Social Workerr. Management plans discussed with the patient, family and they are in agreement.  CODE STATUS: DNR  TOTAL critical TIME TAKING CARE OF THIS PATIENT:40 minutes.   POSSIBLE D/C IN 1-2 DAYS, DEPENDING ON CLINICAL CONDITION.   Hubbert Landrigan M.D on 09/10/2016 at 5:02 PM  Between 7am to 6pm - Pager - [phone]  After 6pm go to [email or web address] - Social research officer, government  [facility] [place] [facility]  Office  [phone]  CC: Primary care physician; Lynnea Ferrier, MD

## 2016-09-10 NOTE — Clinical Social Work Note (Signed)
Clinical Social Work Assessment  Patient Details  Name: Craig Gallegos MRN: [id number] Date of Birth: Jun 23, 1920  Date of referral:  09/10/16               Reason for consult:  Facility Placement                Permission sought to share information with:  Facility Industrial/product designerContact Representative Permission granted to share information::  Yes, Verbal Permission Granted  Name::        Agency::     Relationship::     Contact Information:     Housing/Transportation Living arrangements for the past 2 months:  Independent Living Facility Source of Information:  Patient, Friend/Neighbor Patient Interpreter Needed:  None Criminal Activity/Legal Involvement Pertinent to Current Situation/Hospitalization:  No - Comment as needed Significant Relationships:  Adult Children, Other Family Members, Neighbor, [employer] Support Lives with:  Self Do you feel safe going back to the place where you live?  Yes Need for family participation in patient care:  No (Coment)  Care giving concerns:  PT rec for STR   Social Worker assessment / plan:  CSW visited the patient and his neighbor Larey SeatFaye Alexander at bedside to discuss dc planning. The patient gave verbal permission for Lucendia HerrlichFaye to remain, and he reported that she is a contact that we can use in case of questions. The patient will be discharging to [facility], and he is in agreement with that plan. At baseline, the patient is independent with ADLs and all IADLs except for driving. He ambulates with a walker (4WRW with seat). The plan is that he will transport via EMS at discharge.  Faye's contact information is as follows: home: [phone] cell: [phone]   Employment status:  Retired Database administratornsurance information:  Managed Medicare PT Recommendations:  Skilled Nursing Facility Information / Referral to community resources:  Skilled Nursing Facility  Patient/Family's Response to care:  Patient and Lucendia HerrlichFaye thanked CSW for assistance.  Patient/Family's  Understanding of and Emotional Response to Diagnosis, Current Treatment, and Prognosis:  Patient is in agreement with dc plan.  Emotional Assessment Appearance:  Appears younger than stated age Attitude/Demeanor/Rapport:   (Particularly Pleasant) Affect (typically observed):  Accepting, Appropriate, Pleasant, Calm Orientation:  Oriented to Self, Oriented to Place, Oriented to  Time, Oriented to Situation Alcohol / Substance use:  Never Used Psych involvement (Current and /or in the community):  No (Comment)  Discharge Needs  Concerns to be addressed:  Care Coordination Readmission within the last 30 days:  No Current discharge risk:  None Barriers to Discharge:  Continued Medical Work up   UAL CorporationKaren M Xylia Scherger, LCSW 09/10/2016, 4:18 PM

## 2016-09-10 NOTE — Progress Notes (Signed)
Pt. VSS O/N, pt. Slept for total of 6 hours with minimal interruptions.  Pt. Placed on 2 L [place] due to O2 sats dropping intermittently while sleeping. No complaints O/N, pt[facility].  UOP 625 O/N.

## 2016-09-10 NOTE — Progress Notes (Signed)
[facility] Cardiology  SUBJECTIVE: "I'm feeling better"   Vitals:   09/10/16 0700 09/10/16 0800 09/10/16 0900 09/10/16 1000  BP:  90/66  (!) 88/63  Pulse: (!) 59 (!) 50 64 66  Resp: (!) 21 18 (!) 23 (!) 21  Temp:  97.7 F (36.5 C)    TempSrc:  Axillary    SpO2: 98% 92% 97% 97%  Weight:      Height:         Intake/Output Summary (Last 24 hours) at 09/10/16 1132 Last data filed at 09/10/16 0600  Gross per 24 hour  Intake             29.5 ml  Output             1352 ml  Net          -1322.5 ml      PHYSICAL EXAM  General: Well developed, well nourished, in no acute distress HEENT:  Normocephalic and atramatic Neck:  No JVD.  Lungs: Clear bilaterally to auscultation and percussion. Heart: HRRR . Normal S1 and S2 without gallops or murmurs.  Abdomen: Bowel sounds are positive, abdomen soft and non-tender  Msk:  Back normal, normal gait. Normal strength and tone for age. Extremities: No clubbing, cyanosis or edema.   Neuro: Alert and oriented X 3. Psych:  Good affect, responds appropriately   LABS: Basic Metabolic Panel:  Recent Labs  16/06/9600/05/18 0443 09/10/16 1058  NA 145 143  K 4.3 4.2  CL 109 109  CO2 25 26  GLUCOSE 108* 106*  BUN 102* 97*  CREATININE 3.26* 2.99*  CALCIUM 9.5 9.0   Liver Function Tests:  Recent Labs  09/08/16 0513  ALBUMIN 3.6   No results for input(s): LIPASE, AMYLASE in the last 72 hours. CBC:  Recent Labs  09/08/16 0513 09/10/16 0439  WBC 6.9 5.0  HGB 13.2 12.4*  HCT 40.2 38.0*  MCV 80.4 81.4  PLT 95* 89*   Cardiac Enzymes:  Recent Labs  09/07/16 1633 09/07/16 2031  TROPONINI 0.77* 1.57*   BNP: Invalid input(s): POCBNP D-Dimer: No results for input(s): DDIMER in the last 72 hours. Hemoglobin A1C: No results for input(s): HGBA1C in the last 72 hours. Fasting Lipid Panel: No results for input(s): CHOL, HDL, LDLCALC, TRIG, CHOLHDL, LDLDIRECT in the last 72 hours. Thyroid Function Tests: No results for input(s): TSH,  T4TOTAL, T3FREE, THYROIDAB in the last 72 hours.  Invalid input(s): FREET3 Anemia Panel: No results for input(s): VITAMINB12, FOLATE, FERRITIN, TIBC, IRON, RETICCTPCT in the last 72 hours.  [place] Renal  Result Date: 09/08/2016 CLINICAL DATA:  83 year old male with acute renal failure EXAM: RENAL / URINARY TRACT ULTRASOUND COMPLETE COMPARISON:  Prior CT abdomen/ pelvis 07/15/2015 FINDINGS: Right Kidney: Length: 9.3 cm. Mildly echogenic renal parenchyma. No evidence of hydronephrosis or nephrolithiasis. Circumscribed anechoic simple cysts in the lower pole measure 1 cm. Left Kidney: Length: 8.7 cm. Mildly echogenic renal parenchyma. No evidence hydronephrosis or nephrolithiasis. No renal masses. Bladder: Appears normal for degree of bladder distention. Both ureteral jets are identified. Other: Mild perihepatic and perisplenic ascites. At least moderate right-sided pleural effusion. The suprahepatic IVC also appears distended. The prostate gland is enlarged. IMPRESSION: 1. No evidence of hydronephrosis. 2. Mildly echogenic renal parenchyma suggests underlying medical renal disease. 3. At least moderate right-sided pleural effusion and mild upper abdominal ascites. 4. Distended suprahepatic inferior vena cava suggesting passive venous congestion in the setting of elevated right heart pressure. 5. Prostatomegaly. Electronically Signed   By: Vilma PraderHeath  Archer Asa M.D.   On: 09/08/2016 15:24     Echo   TELEMETRY: Sinus rhythm:  ASSESSMENT AND PLAN:  Principal Problem:   Acute on chronic systolic CHF (congestive heart failure) (HCC) Active Problems:   Chronic systolic heart failure (HCC)   Acute CHF (HCC)    1. Acute on chronic systolic congestive heart failure, improved after diuresis and dobutamine infusion 2. Known CAD, status post CABG, currently without chest pain, with borderline elevated troponin, likely demand supply ischemia 3. Acute on chronic renal failure  Recommendations  1. Agree with  current therapy 2. Agree stopping dobutamine infusion due to ventricular ectopy 3. Agree with transfer to telemetry      Marcina Millard, MD, PhD, [facility] 09/10/2016 11:32 AM

## 2016-09-10 NOTE — Progress Notes (Signed)
Subjective Patient was transferred to ICU for dobutamine infusion in an effort to increase cardiac output. He did not tolerated dobutamine as it caused cardiac arrythmias UOP  1775 last 24 hrs States he slept good  Feels a little stronger today No new labs today but BUN/Cr were critically elevated        Current medications: Current Facility-Administered Medications  Medication Dose Route Frequency Provider Last Rate Last Dose  . aspirin EC tablet 325 mg  325 mg Oral Daily Altamese DillingVaibhavkumar Vachhani, MD   325 mg at 09/10/16 0958  . carvedilol (COREG) tablet 3.125 mg  3.125 mg Oral BID WC Enedina FinnerSona Patel, MD   3.125 mg at 09/09/16 1720  . cholecalciferol (VITAMIN D) tablet 1,000 Units  1,000 Units Oral QHS Altamese DillingVaibhavkumar Vachhani, MD   1,000 Units at 09/09/16 2214  . DOBUTamine (DOBUTREX) infusion 4000 mcg/mL  2.5-20 mcg/kg/min Intravenous Titrated Valentina GuScott D Christy, RPH   Stopped at 09/09/16 1730  . heparin injection 5,000 Units  5,000 Units Subcutaneous Q8H Altamese DillingVaibhavkumar Vachhani, MD   5,000 Units at 09/10/16 0526  . multivitamin-lutein (OCUVITE-LUTEIN) capsule 1 capsule  1 capsule Oral BID Altamese DillingVaibhavkumar Vachhani, MD   1 capsule at 09/10/16 0958  . pantoprazole (PROTONIX) EC tablet 40 mg  40 mg Oral QAC breakfast Altamese DillingVaibhavkumar Vachhani, MD   40 mg at 09/10/16 0858  . simvastatin (ZOCOR) tablet 20 mg  20 mg Oral q1800 Altamese DillingVaibhavkumar Vachhani, MD   20 mg at 09/09/16 1720  . sodium chloride flush (NS) 0.9 % injection 3 mL  3 mL Intravenous Q12H Altamese DillingVaibhavkumar Vachhani, MD   3 mL at 09/10/16 1029      Vital Signs: Blood pressure (!) 88/63, pulse 66, temperature 97.7 F (36.5 C), temperature source Axillary, resp. rate (!) 21, height 5\' 8"  (1.727 m), weight 75.7 kg (166 lb 14.2 oz), SpO2 97 %.   Intake/Output Summary (Last 24 hours) at 09/10/16 1041 Last data filed at 09/10/16 0600  Gross per 24 hour  Intake             29.5 ml  Output             1352 ml  Net          -1322.5 ml    Weight  trends: Filed Weights   09/07/16 1629 09/09/16 1200  Weight: 77.1 kg (170 lb) 75.7 kg (166 lb 14.2 oz)    Physical Exam: General:  NAD, laying in bed  HEENT Anicteric, moist oral mucus membranes  Neck: supple  Lungs: Clear b/l, [place] oxygen  Heart:: Irregular, tachycardic  Abdomen: Soft, NT  Extremities:  ++ dependent leg edema  Neurologic: Alert, oreinted  Skin: Redness over legs, dry skin             Lab results: Basic Metabolic Panel:  Recent Labs Lab 09/07/16 1633 09/08/16 0513 09/09/16 0443  NA 143 142 145  K 5.4* 4.8 4.3  CL 107 108 109  CO2 21* 23 25  GLUCOSE 119* 105* 108*  BUN 98* 105* 102*  CREATININE 3.45* 3.31* 3.26*  CALCIUM 9.8 9.4 9.5    Liver Function Tests:  Recent Labs Lab 09/08/16 0513  ALBUMIN 3.6   No results for input(s): LIPASE, AMYLASE in the last 168 hours. No results for input(s): AMMONIA in the last 168 hours.  CBC:  Recent Labs Lab 09/08/16 0513 09/10/16 0439  WBC 6.9 5.0  HGB 13.2 12.4*  HCT 40.2 38.0*  MCV 80.4 81.4  PLT 95* 89*  Cardiac Enzymes:  Recent Labs Lab 09/07/16 2031  TROPONINI 1.57*    BNP: Invalid input(s): POCBNP  CBG:  Recent Labs Lab 09/09/16 1153  GLUCAP 147*    Microbiology: Recent Results (from the past 720 hour(s))  Blood culture (routine x 2)     Status: Abnormal (Preliminary result)   Collection Time: 09/07/16  8:31 PM  Result Value Ref Range Status   Specimen Description BLOOD RIGHT ARM  Final   Special Requests   Final    BOTTLES DRAWN AEROBIC AND ANAEROBIC AER10CC AER11CC   Culture  Setup Time   Final    GRAM POSITIVE COCCI CRITICAL RESULT CALLED TO, READ BACK BY AND VERIFIED WITH: NATE COOKSON ON 09/09/16 AT 0125 BY TLB ANAEROBIC BOTTLE ONLY    Culture (A)  Final    STAPHYLOCOCCUS SPECIES (COAGULASE NEGATIVE) THE SIGNIFICANCE OF ISOLATING THIS ORGANISM FROM A SINGLE SET OF BLOOD CULTURES WHEN MULTIPLE SETS ARE DRAWN IS UNCERTAIN. PLEASE NOTIFY THE MICROBIOLOGY DEPARTMENT  WITHIN ONE WEEK IF SPECIATION AND SENSITIVITIES ARE REQUIRED. Performed at [facility]    Report Status PENDING  Incomplete  Blood culture (routine x 2)     Status: None (Preliminary result)   Collection Time: 09/07/16  8:31 PM  Result Value Ref Range Status   Specimen Description BLOOD LEFT AC  Final   Special Requests   Final    BOTTLES DRAWN AEROBIC AND ANAEROBIC ANA12CC AER10CC   Culture NO GROWTH 3 DAYS  Final   Report Status PENDING  Incomplete  Rapid Influenza A&B Antigens ([facility] only)     Status: None   Collection Time: 09/07/16  8:31 PM  Result Value Ref Range Status   Influenza A ([facility]) NEGATIVE NEGATIVE Final   Influenza B ([facility]) NEGATIVE NEGATIVE Final  Blood Culture ID Panel (Reflexed)     Status: Abnormal   Collection Time: 09/07/16  8:31 PM  Result Value Ref Range Status   Enterococcus species NOT DETECTED NOT DETECTED Final   Listeria monocytogenes NOT DETECTED NOT DETECTED Final   Staphylococcus species DETECTED (A) NOT DETECTED Final    Comment: NATE COOKSON ON 09/09/16 AT 0125 BY TLB   Staphylococcus aureus NOT DETECTED NOT DETECTED Final   Methicillin resistance NOT DETECTED NOT DETECTED Final   Streptococcus species NOT DETECTED NOT DETECTED Final   Streptococcus agalactiae NOT DETECTED NOT DETECTED Final   Streptococcus pneumoniae NOT DETECTED NOT DETECTED Final   Streptococcus pyogenes NOT DETECTED NOT DETECTED Final   Acinetobacter baumannii NOT DETECTED NOT DETECTED Final   Enterobacteriaceae species NOT DETECTED NOT DETECTED Final   Enterobacter cloacae complex NOT DETECTED NOT DETECTED Final   Escherichia coli NOT DETECTED NOT DETECTED Final   Klebsiella oxytoca NOT DETECTED NOT DETECTED Final   Klebsiella pneumoniae NOT DETECTED NOT DETECTED Final   Proteus species NOT DETECTED NOT DETECTED Final   Serratia marcescens NOT DETECTED NOT DETECTED Final   Haemophilus influenzae NOT DETECTED NOT DETECTED Final   Neisseria meningitidis NOT DETECTED  NOT DETECTED Final   Pseudomonas aeruginosa NOT DETECTED NOT DETECTED Final   Candida albicans NOT DETECTED NOT DETECTED Final   Candida glabrata NOT DETECTED NOT DETECTED Final   Candida krusei NOT DETECTED NOT DETECTED Final   Candida parapsilosis NOT DETECTED NOT DETECTED Final   Candida tropicalis NOT DETECTED NOT DETECTED Final  MRSA PCR Screening     Status: None   Collection Time: 09/08/16  2:15 AM  Result Value Ref Range Status   MRSA by PCR NEGATIVE  NEGATIVE Final    Comment:        The GeneXpert MRSA Assay (FDA approved for NASAL specimens only), is one component of a comprehensive MRSA colonization surveillance program. It is not intended to diagnose MRSA infection nor to guide or monitor treatment for MRSA infections.      Coagulation Studies: No results for input(s): LABPROT, INR in the last 72 hours.  Urinalysis: No results for input(s): COLORURINE, LABSPEC, PHURINE, GLUCOSEU, HGBUR, BILIRUBINUR, KETONESUR, PROTEINUR, UROBILINOGEN, NITRITE, LEUKOCYTESUR in the last 72 hours.  Invalid input(s): APPERANCEUR      Imaging: US Renal  Result Date: 09/08/2016 CLINICAL DATA:  83 year old male with acute renal failure EXAM: RENAL / URINARY TRACT ULTRASOUND COMPLETE COMPARISON:  Prior CT abdomen/ pelvis 07/15/2015 FINDINGS: Right Kidney: Length: 9.3 cm. Mildly echogenic renal parenchyma. No evidence of hydronephrosis or nephrolithiasis. Circumscribed anechoic simple cysts in the lower pole measure 1 cm. Left Kidney: Length: 8.7 cm. Mildly echogenic renal parenchyma. No evidence hydronephrosis or nephrolithiasis. No renal masses. Bladder: Appears normal for degree of bladder distention. Both ureteral jets are identified. Other: Mild perihepatic and perisplenic ascites. At least moderate right-sided pleural effusion. The suprahepatic IVC also appears distended. The prostate gland is enlarged. IMPRESSION: 1. No evidence of hydronephrosis. 2. Mildly echogenic renal parenchyma  suggests underlying medical renal disease. 3. At least moderate right-sided pleural effusion and mild upper abdominal ascites. 4. Distended suprahepatic inferior vena cava suggesting passive venous congestion in the setting of elevated right heart pressure. 5. Prostatomegaly. Electronically Signed   By: Malachy Moan M.D.   On: 09/08/2016 15:24     Assessment & Plan: Pt is a 83 y.o. yo male with  medical problems of BPH, Ch Sys CHF EF 15 %, CAD, HLD, HTN, who was admitted to [facility] on 09/07/2016 for evaluation of worsening SOB  1. ARF on CKD st 3 - likely secondary to cardiorenal syndrome 2. LE edema 3. Ch Systolic CHF  Patient has low normal BP He has total body fluid overload and LE edema Albumin is in the normal range  Recommendations: Agree with holding Losartan and anti-HTN meds to allow higher systolic BP and renal perfusion pressures Iv dobutamine has been stopped because of ventriclar arrythmias OOB to chair Supportive care BMP

## 2016-09-10 NOTE — Progress Notes (Signed)
[facility] Pt to be transferred to room 243. MD aware that pt has been hypotensive, and HR continues to be irregular this shift. Has been maintaining sats in high 90s on RA. Denied pain and discomfort.  Had a 6 beat run VT this Am around 10 AM, and one this afternoon around 1730.

## 2016-09-11 LAB — BASIC METABOLIC PANEL
ANION GAP: 10 (ref 5–15)
BUN: 90 mg/dL — ABNORMAL HIGH (ref 6–20)
CHLORIDE: 108 mmol/L (ref 101–111)
CO2: 25 mmol/L (ref 22–32)
Calcium: 8.8 mg/dL — ABNORMAL LOW (ref 8.9–10.3)
Creatinine, Ser: 2.51 mg/dL — ABNORMAL HIGH (ref 0.61–1.24)
GFR calc Af Amer: 24 mL/min — ABNORMAL LOW (ref >60)
GFR, EST NON AFRICAN AMERICAN: 20 mL/min — AB (ref >60)
Glucose, Bld: 110 mg/dL — ABNORMAL HIGH (ref 65–99)
POTASSIUM: 3.9 mmol/L (ref 3.5–5.1)
Sodium: 143 mmol/L (ref 135–145)

## 2016-09-11 LAB — CULTURE, BLOOD (ROUTINE X 2)

## 2016-09-11 MED ORDER — FUROSEMIDE 40 MG PO TABS
40.0000 mg | ORAL_TABLET | Freq: Every day | ORAL | Status: DC
  Administered 2016-09-11 – 2016-09-12 (×2): 40 mg via ORAL
  Filled 2016-09-11 (×2): qty 1

## 2016-09-11 NOTE — Progress Notes (Signed)
[employer] [facility] [place] [facility]   PATIENT NAME: Craig Gallegos    MR#:  [id number]  DATE OF BIRTH:  1920/06/19  SUBJECTIVE:   -appears weak but overall feels better  REVIEW OF SYSTEMS:  Review of Systems  Constitutional: Positive for malaise/fatigue. Negative for chills and fever.  HENT: Negative for congestion, ear discharge, hearing loss and nosebleeds.   Eyes: Negative for blurred vision.  Respiratory: Positive for shortness of breath. Negative for cough and wheezing.   Cardiovascular: Positive for leg swelling. Negative for chest pain and palpitations.  Gastrointestinal: Negative for abdominal pain, constipation, diarrhea, nausea and vomiting.  Genitourinary: Negative for dysuria.  Musculoskeletal: Negative for myalgias.  Neurological: Negative for dizziness, speech change, focal weakness, seizures and headaches.  Psychiatric/Behavioral: Negative for depression.    DRUG ALLERGIES:   Allergies  Allergen Reactions  . Horse-Derived Products Hives    VITALS:  Blood pressure (!) 104/55, pulse (!) 46, temperature 97.5 F (36.4 C), temperature source Oral, resp. rate 15, height 5\' 8"  (1.727 m), weight 75.7 kg (166 lb 14.2 oz), SpO2 100 %.  PHYSICAL EXAMINATION:  Physical Exam  GENERAL:  83 y.o.-year-old patient lying in the bed with no acute distress.  EYES: Pupils equal, round, reactive to light and accommodation. No scleral icterus. Extraocular muscles intact.  HEENT: Head atraumatic, normocephalic. Oropharynx and nasopharynx clear.  NECK:  Supple, no jugular venous distention. No thyroid enlargement, no tenderness.  LUNGS: Normal breath sounds bilaterally, no wheezing, rales,rhonchi or crepitation. No use of accessory muscles of respiration. Decreased bibasilar breath sounds CARDIOVASCULAR: S1, S2 normal. No murmurs, rubs, or gallops.  ABDOMEN: Soft, nontender, nondistended. Bowel sounds present. No organomegaly or mass.  EXTREMITIES: 3+ lower  extremity edema with erythema over the feet. No warmth or tenderness noted, No cyanosis, or clubbing.  NEUROLOGIC: Cranial nerves II through XII are intact. Muscle strength 5/5 in all extremities. Sensation intact. Gait not checked.  PSYCHIATRIC: The patient is alert and oriented x 3.  SKIN: No obvious rash, lesion, or ulcer.    LABORATORY PANEL:   CBC  Recent Labs Lab 09/10/16 0439  WBC 5.0  HGB 12.4*  HCT 38.0*  PLT 89*   ------------------------------------------------------------------------------------------------------------------  Chemistries   Recent Labs Lab 09/11/16 0503  NA 143  K 3.9  CL 108  CO2 25  GLUCOSE 110*  BUN 90*  CREATININE 2.51*  CALCIUM 8.8*   ------------------------------------------------------------------------------------------------------------------  Cardiac Enzymes  Recent Labs Lab 09/07/16 2031  TROPONINI 1.57*   ------------------------------------------------------------------------------------------------------------------  RADIOLOGY:  No results found.  EKG:   Orders placed or performed during the hospital encounter of 09/07/16  . EKG 12-Lead  . EKG 12-Lead  . ED EKG  . ED EKG    ASSESSMENT AND PLAN:   Craig Gallegos  is a 83 y.o. male with a known history of Congestive heart failure with systolic dysfunction, EF of 15% from 2 weeks ago, CK D stage IV, CAD status post angioplasty long time ago, hypertension and hyperlipidemia presents from home secondary to worsening shortness of breath.  #1 acute on chronic dyspnea-worsening shortness of breath. Could be from underlying worsening heart failure. -Acute on chronic systolic CHF exacerbation, known EF 15% -now off DObutamine gtt -BNP is significantly elevated--but worsening creatinine -echocardiogram done as outpatient in November 2017 -Patient weaned off oxygen -CXR with cardiomegaly, no pulmonary edema -no s/o pneumonia. D/c levaquin -d/w Nephrology   #2  acute on chronic kidney disease-Baseline creatinine around 2 with GFR of 30. - creatinine  is much worse at 3.3----2.5 - avoid nephrotoxins -  Nephrology consulted appreciate input -Renal ultrasound noted-shows medical renal dz -Home dose Lasix resumed.  #3 PVC's, intermittent NSVT -now off dobutamine gtt  #4 Elevated troponin- demand ischemia likely,   Denies any chest pain On asa, toprol, statin. Hold losartan due to ARF  #5 DVT Prophylaxis- SQ heparin  We'll discharge to rehabilitation tomorrow  All the records are reviewed and case discussed with Care Management/Social Workerr. Management plans discussed with the patient, family and they are in agreement.  CODE STATUS: DNR  TOTAL critical TIME TAKING CARE OF THIS PATIENT:40 minutes.   POSSIBLE D/C IN 1-2 DAYS, DEPENDING ON CLINICAL CONDITION.   Chace Klippel M.D on 09/11/2016 at 12:22 PM  Between 7am to 6pm - Pager - [phone]  After 6pm go to [email or web address] - Social research officer, government  [facility]  Office  [phone]  CC: Primary care physician; Lynnea Ferrier, MD

## 2016-09-11 NOTE — Clinical Social Work Placement (Signed)
   CLINICAL SOCIAL WORK PLACEMENT  NOTE  Date:  09/11/2016  Patient Details  Name: Orville GovernLee F Teodoro MRN: [id number] Date of Birth: 12-Jan-1920  Clinical Social Work is seeking post-discharge placement for this patient at the Skilled  Nursing Facility level of care (*CSW will initial, date and re-position this form in  chart as items are completed):  Yes   Patient/family provided with [facility] Clinical Social Work Department's list of facilities offering this level of care within the geographic area requested by the patient (or if unable, by the patient's family).  Yes   Patient/family informed of their freedom to choose among providers that offer the needed level of care, that participate in Medicare, Medicaid or managed care program needed by the patient, have an available bed and are willing to accept the patient.  Yes   Patient/family informed of [facility]'s ownership interest in [facility] and [facility], as well as of the fact that they are under no obligation to receive care at these facilities.  PASRR submitted to EDS on       PASRR number received on       Existing PASRR number confirmed on 09/11/16     FL2 transmitted to all facilities in geographic area requested by pt/family on 09/11/16     FL2 transmitted to all facilities within larger geographic area on       Patient informed that his/her managed care company has contracts with or will negotiate with certain facilities, including the following:        Yes   Patient/family informed of bed offers received.  Patient chooses bed at  [facility]     Physician recommends and patient chooses bed at  [facility]([place]    Patient to be transferred to  [facility] on  .  Patient to be transferred to facility by       Patient family notified on   of transfer.  Name of family member notified:        PHYSICIAN Please sign DNR     Additional Comment:    _______________________________________________ Judi CongKaren  M Carley Glendenning, LCSW 09/11/2016, 3:03 PM

## 2016-09-11 NOTE — Progress Notes (Signed)
Subjective Patient was transferred to ICU for dobutamine infusion in an effort to increase cardiac output. He did not tolerated dobutamine as it caused cardiac arrythmias . He has now been transferred back to the floor   Feels a little stronger today; currently on room air BUN/creatinine have slightly improved today        Current medications: Current Facility-Administered Medications  Medication Dose Route Frequency Provider Last Rate Last Dose  . aspirin EC tablet 325 mg  325 mg Oral Daily Altamese DillingVaibhavkumar Vachhani, MD   325 mg at 09/11/16 1028  . carvedilol (COREG) tablet 3.125 mg  3.125 mg Oral BID WC Enedina FinnerSona Patel, MD   3.125 mg at 09/11/16 0935  . cholecalciferol (VITAMIN D) tablet 1,000 Units  1,000 Units Oral QHS Altamese DillingVaibhavkumar Vachhani, MD   1,000 Units at 09/10/16 2112  . furosemide (LASIX) tablet 40 mg  40 mg Oral Daily Enedina FinnerSona Patel, MD   40 mg at 09/11/16 1217  . heparin injection 5,000 Units  5,000 Units Subcutaneous Q8H Altamese DillingVaibhavkumar Vachhani, MD   5,000 Units at 09/11/16 0518  . multivitamin-lutein (OCUVITE-LUTEIN) capsule 1 capsule  1 capsule Oral BID Altamese DillingVaibhavkumar Vachhani, MD   1 capsule at 09/11/16 1028  . pantoprazole (PROTONIX) EC tablet 40 mg  40 mg Oral QAC breakfast Altamese DillingVaibhavkumar Vachhani, MD   40 mg at 09/11/16 0935  . simvastatin (ZOCOR) tablet 20 mg  20 mg Oral q1800 Altamese DillingVaibhavkumar Vachhani, MD   20 mg at 09/10/16 1811  . sodium chloride flush (NS) 0.9 % injection 3 mL  3 mL Intravenous Q12H Altamese DillingVaibhavkumar Vachhani, MD   3 mL at 09/11/16 1029      Vital Signs: Blood pressure (!) 97/42, pulse 65, temperature 98.2 F (36.8 C), resp. rate 17, height 5\' 8"  (1.727 m), weight 75.7 kg (166 lb 14.2 oz), SpO2 100 %.   Intake/Output Summary (Last 24 hours) at 09/11/16 1248 Last data filed at 09/11/16 0732  Gross per 24 hour  Intake              480 ml  Output              750 ml  Net             -270 ml    Weight trends: Filed Weights   09/07/16 1629 09/09/16 1200  Weight: 77.1  kg (170 lb) 75.7 kg (166 lb 14.2 oz)    Physical Exam: General:  NAD, laying in bed  HEENT Anicteric, moist oral mucus membranes  Neck: supple  Lungs: Clear b/l, Room air   Heart:: Irregular, tachycardic  Abdomen: Soft, NT  Extremities:  + dependent leg edema  Neurologic: Alert, oreinted  Skin: Hyperemia over legs, dry skin             Lab results: Basic Metabolic Panel:  Recent Labs Lab 09/09/16 0443 09/10/16 1058 09/11/16 0503  NA 145 143 143  K 4.3 4.2 3.9  CL 109 109 108  CO2 25 26 25   GLUCOSE 108* 106* 110*  BUN 102* 97* 90*  CREATININE 3.26* 2.99* 2.51*  CALCIUM 9.5 9.0 8.8*    Liver Function Tests:  Recent Labs Lab 09/08/16 0513  ALBUMIN 3.6   No results for input(s): LIPASE, AMYLASE in the last 168 hours. No results for input(s): AMMONIA in the last 168 hours.  CBC:  Recent Labs Lab 09/08/16 0513 09/10/16 0439  WBC 6.9 5.0  HGB 13.2 12.4*  HCT 40.2 38.0*  MCV 80.4 81.4  PLT 95*  89*    Cardiac Enzymes:  Recent Labs Lab 09/07/16 2031  TROPONINI 1.57*    BNP: Invalid input(s): POCBNP  CBG:  Recent Labs Lab 09/09/16 1153  GLUCAP 147*    Microbiology: Recent Results (from the past 720 hour(s))  Blood culture (routine x 2)     Status: Abnormal   Collection Time: 09/07/16  8:31 PM  Result Value Ref Range Status   Specimen Description BLOOD RIGHT ARM  Final   Special Requests   Final    BOTTLES DRAWN AEROBIC AND ANAEROBIC AER10CC AER11CC   Culture  Setup Time   Final    GRAM POSITIVE COCCI CRITICAL RESULT CALLED TO, READ BACK BY AND VERIFIED WITH: NATE COOKSON ON 09/09/16 AT 0125 BY TLB ANAEROBIC BOTTLE ONLY    Culture (A)  Final    STAPHYLOCOCCUS SPECIES (COAGULASE NEGATIVE) THE SIGNIFICANCE OF ISOLATING THIS ORGANISM FROM A SINGLE SET OF BLOOD CULTURES WHEN MULTIPLE SETS ARE DRAWN IS UNCERTAIN. PLEASE NOTIFY THE MICROBIOLOGY DEPARTMENT WITHIN ONE WEEK IF SPECIATION AND SENSITIVITIES ARE REQUIRED. Performed at [facility]    Report Status 09/11/2016 FINAL  Final  Blood culture (routine x 2)     Status: None (Preliminary result)   Collection Time: 09/07/16  8:31 PM  Result Value Ref Range Status   Specimen Description BLOOD LEFT AC  Final   Special Requests   Final    BOTTLES DRAWN AEROBIC AND ANAEROBIC ANA12CC AER10CC   Culture NO GROWTH 4 DAYS  Final   Report Status PENDING  Incomplete  Rapid Influenza A&B Antigens ([facility] only)     Status: None   Collection Time: 09/07/16  8:31 PM  Result Value Ref Range Status   Influenza A ([facility]) NEGATIVE NEGATIVE Final   Influenza B ([facility]) NEGATIVE NEGATIVE Final  Blood Culture ID Panel (Reflexed)     Status: Abnormal   Collection Time: 09/07/16  8:31 PM  Result Value Ref Range Status   Enterococcus species NOT DETECTED NOT DETECTED Final   Listeria monocytogenes NOT DETECTED NOT DETECTED Final   Staphylococcus species DETECTED (A) NOT DETECTED Final    Comment: NATE COOKSON ON 09/09/16 AT 0125 BY TLB   Staphylococcus aureus NOT DETECTED NOT DETECTED Final   Methicillin resistance NOT DETECTED NOT DETECTED Final   Streptococcus species NOT DETECTED NOT DETECTED Final   Streptococcus agalactiae NOT DETECTED NOT DETECTED Final   Streptococcus pneumoniae NOT DETECTED NOT DETECTED Final   Streptococcus pyogenes NOT DETECTED NOT DETECTED Final   Acinetobacter baumannii NOT DETECTED NOT DETECTED Final   Enterobacteriaceae species NOT DETECTED NOT DETECTED Final   Enterobacter cloacae complex NOT DETECTED NOT DETECTED Final   Escherichia coli NOT DETECTED NOT DETECTED Final   Klebsiella oxytoca NOT DETECTED NOT DETECTED Final   Klebsiella pneumoniae NOT DETECTED NOT DETECTED Final   Proteus species NOT DETECTED NOT DETECTED Final   Serratia marcescens NOT DETECTED NOT DETECTED Final   Haemophilus influenzae NOT DETECTED NOT DETECTED Final   Neisseria meningitidis NOT DETECTED NOT DETECTED Final   Pseudomonas aeruginosa NOT DETECTED NOT DETECTED Final    Candida albicans NOT DETECTED NOT DETECTED Final   Candida glabrata NOT DETECTED NOT DETECTED Final   Candida krusei NOT DETECTED NOT DETECTED Final   Candida parapsilosis NOT DETECTED NOT DETECTED Final   Candida tropicalis NOT DETECTED NOT DETECTED Final  MRSA PCR Screening     Status: None   Collection Time: 09/08/16  2:15 AM  Result Value Ref Range Status   MRSA  by PCR NEGATIVE NEGATIVE Final    Comment:        The GeneXpert MRSA Assay (FDA approved for NASAL specimens only), is one component of a comprehensive MRSA colonization surveillance program. It is not intended to diagnose MRSA infection nor to guide or monitor treatment for MRSA infections.      Coagulation Studies: No results for input(s): LABPROT, INR in the last 72 hours.  Urinalysis: No results for input(s): COLORURINE, LABSPEC, PHURINE, GLUCOSEU, HGBUR, BILIRUBINUR, KETONESUR, PROTEINUR, UROBILINOGEN, NITRITE, LEUKOCYTESUR in the last 72 hours.  Invalid input(s): APPERANCEUR      Imaging: No results found.   Assessment & Plan: Pt is a 83 y.o. yo male with  medical problems of BPH, Ch Sys CHF EF 15 %, CAD, HLD, HTN, who was admitted to [facility] on 09/07/2016 for evaluation of worsening SOB  1. ARF on CKD st 3 - likely secondary to cardiorenal syndrome 2. LE edema 3. Ch Systolic CHF  Patient has low normal BP He has total body fluid overload and LE edema Albumin is in the normal range  Recommendations: Agree with holding Losartan and anti-HTN meds to allow higher systolic BP and renal perfusion pressures Iv dobutamine has been stopped because of ventriclar arrythmias OOB to chair Supportive care

## 2016-09-11 NOTE — Progress Notes (Signed)
[facility]  SUBJECTIVE: "I'm feeling better"   Vitals:   09/10/16 1600 09/10/16 1804 09/10/16 1939 09/11/16 0408  BP: (!) 81/53 (!) 82/42 (!) 86/53 (!) 104/55  Pulse: 67 86 65 (!) 46  Resp: (!) 22 (!) 22 18 15   Temp:  97.5 F (36.4 C) 97.5 F (36.4 C)   TempSrc:  Oral Oral   SpO2: 97% 98% 94% 100%  Weight:      Height:         Intake/Output Summary (Last 24 hours) at 09/11/16 1115 Last data filed at 09/11/16 0732  Gross per 24 hour  Intake              480 ml  Output              750 ml  Net             -270 ml      PHYSICAL EXAM  General: Well developed, well nourished, in no acute distress HEENT:  Normocephalic and atramatic Neck:  No JVD.  Lungs: Clear bilaterally to auscultation and percussion. Heart: HRRR . Normal S1 and S2 without gallops or murmurs.  Abdomen: Bowel sounds are positive, abdomen soft and non-tender  Msk:  Back normal, normal gait. Normal strength and tone for age. Extremities: No clubbing, cyanosis or edema.   Neuro: Alert and oriented X 3. Psych:  Good affect, responds appropriately   LABS: Basic Metabolic Panel:  Recent Labs  16/06/9600/06/18 1058 09/11/16 0503  NA 143 143  K 4.2 3.9  CL 109 108  CO2 26 25  GLUCOSE 106* 110*  BUN 97* 90*  CREATININE 2.99* 2.51*  CALCIUM 9.0 8.8*   Liver Function Tests: No results for input(s): AST, ALT, ALKPHOS, BILITOT, PROT, ALBUMIN in the last 72 hours. No results for input(s): LIPASE, AMYLASE in the last 72 hours. CBC:  Recent Labs  09/10/16 0439  WBC 5.0  HGB 12.4*  HCT 38.0*  MCV 81.4  PLT 89*   Cardiac Enzymes: No results for input(s): CKTOTAL, CKMB, CKMBINDEX, TROPONINI in the last 72 hours. BNP: Invalid input(s): POCBNP D-Dimer: No results for input(s): DDIMER in the last 72 hours. Hemoglobin A1C: No results for input(s): HGBA1C in the last 72 hours. Fasting Lipid Panel: No results for input(s): CHOL, HDL, LDLCALC, TRIG, CHOLHDL, LDLDIRECT in the last 72 hours. Thyroid  Function Tests: No results for input(s): TSH, T4TOTAL, T3FREE, THYROIDAB in the last 72 hours.  Invalid input(s): FREET3 Anemia Panel: No results for input(s): VITAMINB12, FOLATE, FERRITIN, TIBC, IRON, RETICCTPCT in the last 72 hours.  No results found.   Echo   TELEMETRY: Sinus rhythm:  ASSESSMENT AND PLAN:  Principal Problem:   Acute on chronic systolic CHF (congestive heart failure) (HCC) Active Problems:   Chronic systolic heart failure (HCC)   Acute CHF (HCC)    1. Acute on chronic systolic congestive heart failure, stable 2. Status post CABG, without chest pain, with borderline elevated troponin, likely demand supply ischemia 3. Likely discharge to SNF tomorrow  Recommendations  1. Agree with current therapy 2. No further cardiac diagnostics at this time  Sign off for now, please call if any questions   Marcina MillardAlexander Ariyannah Pauling, MD, PhD, [facility] Center[facility] 09/11/2016 11:15 AM

## 2016-09-12 LAB — PROCALCITONIN

## 2016-09-12 LAB — CULTURE, BLOOD (ROUTINE X 2): CULTURE: NO GROWTH

## 2016-09-12 MED ORDER — CARVEDILOL 3.125 MG PO TABS: 3.1250 mg | ORAL_TABLET | Freq: Two times a day (BID) | ORAL | 2 refills | Status: AC

## 2016-09-12 NOTE — Clinical Social Work Note (Addendum)
[facility], [employer] requested updated clinicals.  CSW faxed requested clinicals to insurance company to Vernona RiegerLaura [phone].  3:00pm  CSW received authorization for patient to go [facility], authorization number is [id number]. Patient to be d[facility]c'[facility].  Patient and family agreeable to plans will transport via ems RN to call report to Room [phone].  Ervin KnackEric R. Shaquala Broeker, MSW, Theresia MajorsLCSWA [phone]  09/12/2016 2:08 PM

## 2016-09-12 NOTE — Care Management Important Message (Signed)
Important Message  Patient Details  Name: Craig Gallegos MRN: [id number] Date of Birth: September 29, 1919   Medicare Important Message Given:  Yes  Initial signed IM printed from Epic and given to patient.   Eber HongGreene, Xana Bradt R, RN 09/12/2016, 9:09 AM

## 2016-09-12 NOTE — Progress Notes (Signed)
Report given to EMS.  Patient taken from hospital via stretcher to be [facility] by [facility].

## 2016-09-12 NOTE — Evaluation (Signed)
Occupational Therapy Evaluation Patient Details Name: Craig Gallegos MRN: [id number] DOB: May 10, 1920 Today's Date: 09/12/2016    History of Present Illness Pt. is a 83 y.o. male who [facility]. Pt. PMHx includes: Benign Prostate Hypertrophy, CKD, CAD, Hyperlipidemia, and HTN.    Clinical Impression   Pt. Is a 83 y.o. Male who [facility]. Pt. Presents with weakness, decreased activity tolerance, and decreased functional mobility for ADLs. Pt. Could benefit from skilled OT services for ADL training, A/E training, UE there. Ex., energy conservation, and work simplification techniques. Pt. could benefit from follow-up OT services at SNF level of care.    Follow Up Recommendations  SNF    Equipment Recommendations       Recommendations for Other Services       Precautions / Restrictions Precautions Precautions: Fall Restrictions Weight Bearing Restrictions: No         Balance Overall balance assessment: Needs assistance Sitting-balance support: No upper extremity supported Sitting balance-Leahy Scale: Good     Standing balance support: No upper extremity supported Standing balance-Leahy Scale: Fair Standing balance comment: Able to maintain feet apart and feet together balance without UE support. Positive Rhomberg for severe sway but no complete LOB. Single leg balance approximately 1-2 seconds on each leg                            ADL Overall ADL's : Needs assistance/impaired Eating/Feeding: Set up   Grooming: Minimal assistance                                 General ADL Comments: Pt. education was provided about A/E use for energy conservation     Vision     Perception     Praxis      Pertinent Vitals/Pain Pain Assessment: No/denies pain     Hand Dominance Left   Extremity/Trunk Assessment Upper Extremity Assessment Upper Extremity Assessment: Generalized weakness            Communication Communication Communication: No difficulties   Cognition Arousal/Alertness: Awake/alert Behavior During Therapy: WFL for tasks assessed/performed Overall Cognitive Status: Within Functional Limits for tasks assessed                     General Comments       Exercises      Shoulder Instructions      Home Living Family/patient expects to be discharged to:: Other (Comment) [facility] ind[facility] Living Arrangements: Alone Available Help at Discharge: Friend(s);Available PRN/intermittently Type of Home: Independent living facility Home Access: Level entry     Home Layout: One level     Bathroom Shower/Tub: Tub/shower unit;Walk-in shower   Bathroom Toilet: Standard Bathroom Accessibility: Yes   Home Equipment: Cane - single point;Grab bars - tub/shower;Walker - 4 wheels;Shower seat          Prior Functioning/Environment Level of Independence: Independent with assistive device(s)                 OT Problem List: Decreased strength;Decreased activity tolerance;Decreased safety awareness;Decreased knowledge of use of DME or AE   OT Treatment/Interventions: Self-care/ADL training;Therapeutic exercise;Energy conservation;Therapeutic activities;DME and/or AE instruction;Patient/family education    OT Goals(Current goals can be found in the care plan section) Acute Rehab OT Goals Patient Stated Goal: To retrun home OT Goal Formulation: With patient  Potential to Achieve Goals: Good  OT Frequency: Min 1X/week   Barriers to D/C:            Co-evaluation              End of Session    Activity Tolerance: Patient tolerated treatment well;Patient limited by fatigue Patient left: in bed;with call bell/phone within reach;with bed alarm set   Time: 1215-1230 OT Time Calculation (min): 15 min Charges:  OT General Charges $OT Visit: 1 Procedure OT Evaluation $OT Eval Low Complexity: 1 Procedure G-Codes:    Olegario Messier, MS, OTR/L 09/12/2016, 2:13 PM

## 2016-09-12 NOTE — Progress Notes (Signed)
Physical Therapy Treatment Patient Details Name: Craig Gallegos MRN: [id number] DOB: 09/21/1919 Today's Date: 09/12/2016    History of Present Illness Craig Gallegos  is a 83 y.o. male with a known history of benign prostate hypertrophy, systolic congestive heart failure ejection fraction 15%, chronic kidney disease, coronary artery disease, hyperlipidemia, hypertension - lives in independent living facility and completely independent. For last few days he started feeling worsening in his shortness of breath in spite of taking all his medications on time. He denies any associated cough, chest pain, fever, chills. He noticed some leg swelling. In ER he was noted to have pulmonary edema on chest x-ray and acute systolic congestive heart failure. His renal function was noted slightly worse than his baseline and chest x-ray also showed some possibility of atelectasis versus pneumonia on the right lung.    PT Comments    Pt continues to demonstrate difficulty with bed mobility and transfers as well as considerable DOE with ambulation which is worse than his baseline. VSS throughout ambulation but pt does demonstrate increase in RR and fatigue. SaO2>90% on room air. He requires standing rest break once he is at RN station and then is able to ambulate back to his bed. Pt able to complete all supine exercises as instructed by therapist. He will need SNF placement at discharge in order to facilitate safe return to prior level of function.   Follow Up Recommendations  SNF     Equipment Recommendations  Other (comment) (TBD further at SNF. RW may be better than rollator for pt)    Recommendations for Other Services       Precautions / Restrictions Precautions Precautions: Fall Restrictions Weight Bearing Restrictions: No    Mobility  Bed Mobility Overal bed mobility: Needs Assistance Bed Mobility: Supine to Sit;Sit to Supine     Supine to sit: Min assist Sit to supine: Supervision    General bed mobility comments: Pt requires UE assist to pull up to sitting. HOB elevated and increased time required to perform due to weakness  Transfers Overall transfer level: Needs assistance Equipment used: Rolling walker (2 wheeled) Transfers: Sit to/from Stand Sit to Stand: Min assist         General transfer comment: Pt demonstrates posterior leaning and using back on knees supported on bed to assist with transfers. Once upright he is able to stabilize and is CGA only for balance  Ambulation/Gait Ambulation/Gait assistance: Min assist Ambulation Distance (Feet): 50 Feet Assistive device: Rolling walker (2 wheeled) Gait Pattern/deviations: Decreased step length - right;Decreased step length - left Gait velocity: Decreased Gait velocity interpretation: <1.8 ft/sec, indicative of risk for recurrent falls General Gait Details: Pt ambulates with forward flexed posture and short step length. He demonstrates considerable DOE which pt reports is worse than baseline. Gait speed is slow and pt requires standing reast break once he is out at nursing station. VSS stable throughout ambulation with SaO2>90% on room air but increase in RR noted   Stairs            Wheelchair Mobility    Modified Rankin (Stroke Patients Only)       Balance Overall balance assessment: Needs assistance Sitting-balance support: No upper extremity supported Sitting balance-Leahy Scale: Good     Standing balance support: No upper extremity supported Standing balance-Leahy Scale: Fair Standing balance comment: Able to maintain feet apart and feet together balance without UE support. Positive Rhomberg for severe sway but no complete LOB. Single leg balance approximately 1-2 seconds  on each leg                    Cognition Arousal/Alertness: Awake/alert Behavior During Therapy: WFL for tasks assessed/performed Overall Cognitive Status: Within Functional Limits for tasks assessed                       Exercises General Exercises - Lower Extremity Ankle Circles/Pumps: AROM;Both;10 reps;Supine Quad Sets: Strengthening;Both;10 reps;Supine Gluteal Sets: Strengthening;Both;10 reps;Supine Hip ABduction/ADduction: Strengthening;Both;10 reps;Supine Straight Leg Raises: Strengthening;Both;10 reps;Supine    General Comments        Pertinent Vitals/Pain Pain Assessment: No/denies pain    Home Living                      Prior Function            PT Goals (current goals can now be found in the care plan section) Acute Rehab PT Goals Patient Stated Goal: Return to prior level of function. Pt is considering need for higher level care or in home aids PT Goal Formulation: With patient Time For Goal Achievement: 09/23/16 Potential to Achieve Goals: Good Progress towards PT goals: Progressing toward goals    Frequency    Min 2X/week      PT Plan Current plan remains appropriate    Co-evaluation             End of Session Equipment Utilized During Treatment: Gait belt Activity Tolerance: Patient limited by fatigue Patient left: in bed;with call bell/phone within reach;with bed alarm set;with family/visitor present     Time: [id number] PT Time Calculation (min) (ACUTE ONLY): 20 min  Charges:  $Therapeutic Exercise: 8-22 mins                    G Codes:      Sharalyn InkJason D Lacee Grey PT, DPT   Barby Colvard 09/12/2016, 12:19 PM

## 2016-09-12 NOTE — Discharge Summary (Signed)
[facility] - [place] [facility]   PATIENT NAME: Craig Gallegos    MR#:  [id number]  DATE OF BIRTH:  01-Feb-1920  DATE OF ADMISSION:  09/07/2016   ADMITTING PHYSICIAN: Altamese Dilling, MD  DATE OF DISCHARGE: 09/12/16  PRIMARY CARE PHYSICIAN: Curtis Sites III, MD    ADMISSION DIAGNOSIS:  Hyperkalemia [E87.5] Frequent PVCs [I49.3] Acute renal failure, unspecified acute renal failure type (HCC) [N17.9] Acute on chronic congestive heart failure, unspecified congestive heart failure type (HCC) [I50.9]  DISCHARGE DIAGNOSIS:  Acute on chronic systolic CHF Severe cardiomyopathy Dysrrthmia (frequent PVC) CKD-IV  SECONDARY DIAGNOSIS:   Past Medical History:  Diagnosis Date  . BPH (benign prostatic hyperplasia)   . CHF (congestive heart failure) (HCC)    EF 15% from recent ECHO in Nov 2017  . CKD (chronic kidney disease) stage 4, GFR 15-29 ml/min (HCC)   . Coronary artery disease    angioplasty > 30 years ago  . Hyperlipidemia   . Hypertension     HOSPITAL COURSE:  Craig Gallegos a 83 y.o. malewith a known history of Congestive heart failure with systolic dysfunction, EF of 15% from 2 weeks ago, CK D stage IV, CAD status post angioplasty long time ago, hypertension and hyperlipidemia presents from home secondary to worsening shortness of breath.  #1 acute on chronic dyspnea-worsening shortness of breath from underlying worsening heart failure. -Acute on chronic systolic CHF exacerbation, known EF 15% -now off DObutamine gtt -BNP is significantly elevated--but worsening creatinine -echocardiogram done as outpatient in November 2017 -Patient weaned off oxygen -CXR with cardiomegaly, no pulmonary edema -no s/o pneumonia. D/c levaquin -d/w Nephrology   #2 acute on chronic kidney disease-Baseline creatinine around 2 with GFR of 30. - creatinine is much worse at 3.3----2.5 - avoid nephrotoxins -  Nephrology consulted appreciate  input -Renal ultrasound noted-shows medical renal dz -Home dose Lasix resumed.  #3 PVC's, intermittent NSVT -now off dobutamine gtt  #4 Elevated troponin- demand ischemia likely,  Denies any chest pain On asa, toprol, statin.  Hold losartan due to ARF  #5 DVT Prophylaxis- SQ heparin  Overall appears at baseline D/c to [facility] today CONSULTS OBTAINED:  Treatment Team:  Mosetta Pigeon, MD Marcina Millard, MD  DRUG ALLERGIES:   Allergies  Allergen Reactions  . Horse-Derived Products Hives    DISCHARGE MEDICATIONS:   Current Discharge Medication List    START taking these medications   Details  carvedilol (COREG) 3.125 MG tablet Take 1 tablet (3.125 mg total) by mouth 2 (two) times daily with a meal. Qty: 60 tablet, Refills: 2      CONTINUE these medications which have NOT CHANGED   Details  acetaminophen (TYLENOL) 325 MG tablet Take 2 tablets (650 mg total) by mouth every 6 (six) hours as needed for mild pain (or Fever >/= 101). Qty: 30 tablet, Refills: 0    aspirin EC 325 MG tablet Take 325 mg by mouth daily.    cholecalciferol (VITAMIN D) 1000 UNITS tablet Take 1,000 Units by mouth at bedtime.    furosemide (LASIX) 40 MG tablet Take 40 mg by mouth daily.     isosorbide mononitrate (IMDUR) 30 MG 24 hr tablet Take 30 mg by mouth daily.    losartan (COZAAR) 100 MG tablet Take 100 mg by mouth daily.     Multiple Vitamins-Minerals (PRESERVISION AREDS 2 PO) Take 1 capsule by mouth 2 (two) times daily.     nitroGLYCERIN (NITROSTAT) 0.4 MG SL tablet Place 0.4 mg under  the tongue every 5 (five) minutes as needed for chest pain.     omeprazole (PRILOSEC) 20 MG capsule Take 20 mg by mouth at bedtime.    simvastatin (ZOCOR) 20 MG tablet Take 20 mg by mouth daily at 6 PM.  Refills: 2      STOP taking these medications     metoprolol succinate (TOPROL-XL) 25 MG 24 hr tablet         If you experience worsening of your admission symptoms, develop  shortness of breath, life threatening emergency, suicidal or homicidal thoughts you must seek medical attention immediately by calling 911 or calling your MD immediately  if symptoms less severe.  You Must read complete instructions/literature along with all the possible adverse reactions/side effects for all the Medicines you take and that have been prescribed to you. Take any new Medicines after you have completely understood and accept all the possible adverse reactions/side effects.   Please note  You were cared for by a hospitalist during your hospital stay. If you have any questions about your discharge medications or the care you received while you were in the hospital after you are discharged, you can call the unit and asked to speak with the hospitalist on call if the hospitalist that took care of you is not available. Once you are discharged, your primary care physician will handle any further medical issues. Please note that NO REFILLS for any discharge medications will be authorized once you are discharged, as it is imperative that you return to your primary care physician (or establish a relationship with a primary care physician if you do not have one) for your aftercare needs so that they can reassess your need for medications and monitor your lab values. Today   SUBJECTIVE   Doing well  VITAL SIGNS:  Blood pressure (!) 95/55, pulse 78, temperature 97.6 F (36.4 C), temperature source Oral, resp. rate 16, height 5\' 8"  (1.727 m), weight 75.2 kg (165 lb 11.2 oz), SpO2 98 %.  I/O:   Intake/Output Summary (Last 24 hours) at 09/12/16 0730 Last data filed at 09/11/16 1800  Gross per 24 hour  Intake              480 ml  Output              300 ml  Net              180 ml    PHYSICAL EXAMINATION:  GENERAL:  83 y.o.-year-old patient lying in the bed with no acute distress.  EYES: Pupils equal, round, reactive to light and accommodation. No scleral icterus. Extraocular muscles  intact.  HEENT: Head atraumatic, normocephalic. Oropharynx and nasopharynx clear.  NECK:  Supple, no jugular venous distention. No thyroid enlargement, no tenderness.  LUNGS: decreased breath sounds bilaterally, no wheezing, rales,rhonchi or crepitation. No use of accessory muscles of respiration.  CARDIOVASCULAR: S1, S2 normal. No murmurs, rubs, or gallops.  ABDOMEN: Soft, non-tender, non-distended. Bowel sounds present. No organomegaly or mass.  EXTREMITIES: No pedal edema, cyanosis, or clubbing.  NEUROLOGIC: Cranial nerves II through XII are intact. Muscle strength 5/5 in all extremities. Sensation intact. Gait not checked.  PSYCHIATRIC:  patient is alert and oriented x 3.  SKIN: No obvious rash, lesion, or ulcer.   DATA REVIEW:   CBC   Recent Labs Lab 09/10/16 0439  WBC 5.0  HGB 12.4*  HCT 38.0*  PLT 89*    Chemistries   Recent Labs Lab 09/11/16 0503  NA  143  K 3.9  CL 108  CO2 25  GLUCOSE 110*  BUN 90*  CREATININE 2.51*  CALCIUM 8.8*    Microbiology Results   Recent Results (from the past 240 hour(s))  Blood culture (routine x 2)     Status: Abnormal   Collection Time: 09/07/16  8:31 PM  Result Value Ref Range Status   Specimen Description BLOOD RIGHT ARM  Final   Special Requests   Final    BOTTLES DRAWN AEROBIC AND ANAEROBIC AER10CC AER11CC   Culture  Setup Time   Final    GRAM POSITIVE COCCI CRITICAL RESULT CALLED TO, READ BACK BY AND VERIFIED WITH: NATE COOKSON ON 09/09/16 AT 0125 BY TLB ANAEROBIC BOTTLE ONLY    Culture (A)  Final    STAPHYLOCOCCUS SPECIES (COAGULASE NEGATIVE) THE SIGNIFICANCE OF ISOLATING THIS ORGANISM FROM A SINGLE SET OF BLOOD CULTURES WHEN MULTIPLE SETS ARE DRAWN IS UNCERTAIN. PLEASE NOTIFY THE MICROBIOLOGY DEPARTMENT WITHIN ONE WEEK IF SPECIATION AND SENSITIVITIES ARE REQUIRED. Performed at [facility]    Report Status 09/11/2016 FINAL  Final  Blood culture (routine x 2)     Status: None (Preliminary result)   Collection  Time: 09/07/16  8:31 PM  Result Value Ref Range Status   Specimen Description BLOOD LEFT AC  Final   Special Requests   Final    BOTTLES DRAWN AEROBIC AND ANAEROBIC ANA12CC AER10CC   Culture NO GROWTH 4 DAYS  Final   Report Status PENDING  Incomplete  Rapid Influenza A&B Antigens ([facility] only)     Status: None   Collection Time: 09/07/16  8:31 PM  Result Value Ref Range Status   Influenza A ([facility]) NEGATIVE NEGATIVE Final   Influenza B ([facility]) NEGATIVE NEGATIVE Final  Blood Culture ID Panel (Reflexed)     Status: Abnormal   Collection Time: 09/07/16  8:31 PM  Result Value Ref Range Status   Enterococcus species NOT DETECTED NOT DETECTED Final   Listeria monocytogenes NOT DETECTED NOT DETECTED Final   Staphylococcus species DETECTED (A) NOT DETECTED Final    Comment: NATE COOKSON ON 09/09/16 AT 0125 BY TLB   Staphylococcus aureus NOT DETECTED NOT DETECTED Final   Methicillin resistance NOT DETECTED NOT DETECTED Final   Streptococcus species NOT DETECTED NOT DETECTED Final   Streptococcus agalactiae NOT DETECTED NOT DETECTED Final   Streptococcus pneumoniae NOT DETECTED NOT DETECTED Final   Streptococcus pyogenes NOT DETECTED NOT DETECTED Final   Acinetobacter baumannii NOT DETECTED NOT DETECTED Final   Enterobacteriaceae species NOT DETECTED NOT DETECTED Final   Enterobacter cloacae complex NOT DETECTED NOT DETECTED Final   Escherichia coli NOT DETECTED NOT DETECTED Final   Klebsiella oxytoca NOT DETECTED NOT DETECTED Final   Klebsiella pneumoniae NOT DETECTED NOT DETECTED Final   Proteus species NOT DETECTED NOT DETECTED Final   Serratia marcescens NOT DETECTED NOT DETECTED Final   Haemophilus influenzae NOT DETECTED NOT DETECTED Final   Neisseria meningitidis NOT DETECTED NOT DETECTED Final   Pseudomonas aeruginosa NOT DETECTED NOT DETECTED Final   Candida albicans NOT DETECTED NOT DETECTED Final   Candida glabrata NOT DETECTED NOT DETECTED Final   Candida krusei NOT DETECTED NOT  DETECTED Final   Candida parapsilosis NOT DETECTED NOT DETECTED Final   Candida tropicalis NOT DETECTED NOT DETECTED Final  MRSA PCR Screening     Status: None   Collection Time: 09/08/16  2:15 AM  Result Value Ref Range Status   MRSA by PCR NEGATIVE NEGATIVE Final  Comment:        The GeneXpert MRSA Assay (FDA approved for NASAL specimens only), is one component of a comprehensive MRSA colonization surveillance program. It is not intended to diagnose MRSA infection nor to guide or monitor treatment for MRSA infections.     RADIOLOGY:  No results found.   Management plans discussed with the patient, family and they are in agreement.  CODE STATUS:     Code Status Orders        Start     Ordered   09/08/16 0139  Do not attempt resuscitation (DNR)  Continuous    Question Answer Comment  In the event of cardiac or respiratory ARREST Do not call a "code blue"   In the event of cardiac or respiratory ARREST Do not perform Intubation, CPR, defibrillation or ACLS   In the event of cardiac or respiratory ARREST Use medication by any route, position, wound care, and other measures to relive pain and suffering. May use oxygen, suction and manual treatment of airway obstruction as needed for comfort.      09/08/16 0138    Code Status History    Date Active Date Inactive Code Status Order ID Comments User Context   07/25/2016  2:06 PM 07/27/2016  3:16 PM Full Code [id number]  Enid Baasadhika Kalisetti, MD ED   07/15/2015  9:08 AM 07/17/2015  4:08 PM Full Code [id number]  Shaune PollackQing Chen, MD Inpatient   07/15/2015  4:52 AM 07/15/2015  4:52 AM Full Code [id number]  Arnaldo NatalMichael S Diamond, MD ED   07/15/2015  4:51 AM 07/15/2015  4:52 AM DNR [id number]  Arnaldo NatalMichael S Diamond, MD ED   05/25/2015 11:15 AM 05/26/2015  5:53 PM Full Code [id number]  Katha HammingSnehalatha Konidena, MD ED    Advance Directive Documentation   Flowsheet Row Most Recent Value  Type of Advance Directive  Out of facility DNR (pink MOST or yellow form),  Living will, Healthcare Power of Attorney  Pre-existing out of facility DNR order (yellow form or pink MOST form)  No data  "MOST" Form in Place?  No data      TOTAL TIME TAKING CARE OF THIS PATIENT: 40 minutes.    Emmajo Bennette M.D on 09/12/2016 at 7:30 AM  Between 7am to 6pm - Pager - [phone] After 6pm go to [email or web address] - password [email or web address] [facility]  [facility]  Office  [phone]  CC: Primary care physician; Lynnea FerrierBERT J KLEIN III, MD

## 2016-09-12 NOTE — Clinical Social Work Note (Cosign Needed)
[facility].  [employer] said patient is still being reviewed, and to contact them again if CSW does not hear by 1pm.  CSW to continue to follow patient's progress throughout discharge planning.  Ervin KnackEric R. Jalei Shibley, MSW, Theresia MajorsLCSWA [phone]  09/12/2016 12:16 PM

## 2016-09-12 NOTE — Progress Notes (Signed)
Subjective No new renal function testing today. Creatinine yesterday was 2.51. Breathing comfortably at the moment.        Current medications: Current Facility-Administered Medications  Medication Dose Route Frequency Provider Last Rate Last Dose  . aspirin EC tablet 325 mg  325 mg Oral Daily Altamese Dilling, MD   325 mg at 09/12/16 0853  . carvedilol (COREG) tablet 3.125 mg  3.125 mg Oral BID WC Enedina Finner, MD   3.125 mg at 09/12/16 1651  . cholecalciferol (VITAMIN D) tablet 1,000 Units  1,000 Units Oral QHS Altamese Dilling, MD   1,000 Units at 09/11/16 2057  . furosemide (LASIX) tablet 40 mg  40 mg Oral Daily Enedina Finner, MD   40 mg at 09/12/16 0854  . heparin injection 5,000 Units  5,000 Units Subcutaneous Q8H Altamese Dilling, MD   5,000 Units at 09/12/16 1316  . multivitamin-lutein (OCUVITE-LUTEIN) capsule 1 capsule  1 capsule Oral BID Altamese Dilling, MD   1 capsule at 09/12/16 0853  . pantoprazole (PROTONIX) EC tablet 40 mg  40 mg Oral QAC breakfast Altamese Dilling, MD   40 mg at 09/12/16 0853  . simvastatin (ZOCOR) tablet 20 mg  20 mg Oral q1800 Altamese Dilling, MD   20 mg at 09/12/16 1650  . sodium chloride flush (NS) 0.9 % injection 3 mL  3 mL Intravenous Q12H Altamese Dilling, MD   3 mL at 09/12/16 0854      Vital Signs: Blood pressure 104/74, pulse 65, temperature 97.3 F (36.3 C), temperature source Oral, resp. rate (!) 22, height 5\' 8"  (1.727 m), weight 75.2 kg (165 lb 11.2 oz), SpO2 96 %.   Intake/Output Summary (Last 24 hours) at 09/12/16 1739 Last data filed at 09/12/16 0951  Gross per 24 hour  Intake              600 ml  Output                0 ml  Net              600 ml    Weight trends: Filed Weights   09/07/16 1629 09/09/16 1200 09/12/16 0402  Weight: 77.1 kg (170 lb) 75.7 kg (166 lb 14.2 oz) 75.2 kg (165 lb 11.2 oz)    Physical Exam: General: NAD, laying in bed  HEENT Anicteric, moist oral mucus membranes   Neck: supple  Lungs: Clear b/l, Room air   Heart:: Irregular  Abdomen: Soft, NT  Extremities: + dependent leg edema  Neurologic: Alert, oriented, follows commands  Skin: Hyperemia over legs, dry skin             Lab results: Basic Metabolic Panel:  Recent Labs Lab 09/09/16 0443 09/10/16 1058 09/11/16 0503  NA 145 143 143  K 4.3 4.2 3.9  CL 109 109 108  CO2 25 26 25   GLUCOSE 108* 106* 110*  BUN 102* 97* 90*  CREATININE 3.26* 2.99* 2.51*  CALCIUM 9.5 9.0 8.8*    Liver Function Tests:  Recent Labs Lab 09/08/16 0513  ALBUMIN 3.6   No results for input(s): LIPASE, AMYLASE in the last 168 hours. No results for input(s): AMMONIA in the last 168 hours.  CBC:  Recent Labs Lab 09/08/16 0513 09/10/16 0439  WBC 6.9 5.0  HGB 13.2 12.4*  HCT 40.2 38.0*  MCV 80.4 81.4  PLT 95* 89*    Cardiac Enzymes:  Recent Labs Lab 09/07/16 2031  TROPONINI 1.57*    BNP: Invalid input(s): POCBNP  CBG:  Recent Labs Lab 09/09/16 1153  GLUCAP 147*    Microbiology: Recent Results (from the past 720 hour(s))  Blood culture (routine x 2)     Status: Abnormal   Collection Time: 09/07/16  8:31 PM  Result Value Ref Range Status   Specimen Description BLOOD RIGHT ARM  Final   Special Requests   Final    BOTTLES DRAWN AEROBIC AND ANAEROBIC AER10CC AER11CC   Culture  Setup Time   Final    GRAM POSITIVE COCCI CRITICAL RESULT CALLED TO, READ BACK BY AND VERIFIED WITH: NATE COOKSON ON 09/09/16 AT 0125 BY TLB ANAEROBIC BOTTLE ONLY    Culture (A)  Final    STAPHYLOCOCCUS SPECIES (COAGULASE NEGATIVE) THE SIGNIFICANCE OF ISOLATING THIS ORGANISM FROM A SINGLE SET OF BLOOD CULTURES WHEN MULTIPLE SETS ARE DRAWN IS UNCERTAIN. PLEASE NOTIFY THE MICROBIOLOGY DEPARTMENT WITHIN ONE WEEK IF SPECIATION AND SENSITIVITIES ARE REQUIRED. [facility]    Report Status 09/11/2016 FINAL  Final  Blood culture (routine x 2)     Status: None   Collection Time: 09/07/16  8:31  PM  Result Value Ref Range Status   Spec[facility]  Final   Special Requests   Final    BOTTLES DRAWN AEROBIC AND ANAEROBIC ANA12CC AER10CC   Culture NO GROWTH 5 DAYS  Final   Report Status 09/12/2016 FINAL  Final  Rapid Influenza A&B Antigens ([facility] only)     Status: None   Collection Time: 09/07/16  8:31 PM  Result Value Ref Range Status   Influenza A ([facility]) NEGATIVE NEGATIVE Final   Influenza B ([facility]) NEGATIVE NEGATIVE Final  Blood Culture ID Panel (Reflexed)     Status: Abnormal   Collection Time: 09/07/16  8:31 PM  Result Value Ref Range Status   Enterococcus species NOT DETECTED NOT DETECTED Final   Listeria monocytogenes NOT DETECTED NOT DETECTED Final   Staphylococcus species DETECTED (A) NOT DETECTED Final    Comment: NATE COOKSON ON 09/09/16 AT 0125 BY TLB   Staphylococcus aureus NOT DETECTED NOT DETECTED Final   Methicillin resistance NOT DETECTED NOT DETECTED Final   Streptococcus species NOT DETECTED NOT DETECTED Final   Streptococcus agalactiae NOT DETECTED NOT DETECTED Final   Streptococcus pneumoniae NOT DETECTED NOT DETECTED Final   Streptococcus pyogenes NOT DETECTED NOT DETECTED Final   Acinetobacter baumannii NOT DETECTED NOT DETECTED Final   Enterobacteriaceae species NOT DETECTED NOT DETECTED Final   Enterobacter cloacae complex NOT DETECTED NOT DETECTED Final   Escherichia coli NOT DETECTED NOT DETECTED Final   Klebsiella oxytoca NOT DETECTED NOT DETECTED Final   Klebsiella pneumoniae NOT DETECTED NOT DETECTED Final   Proteus species NOT DETECTED NOT DETECTED Final   Serratia marcescens NOT DETECTED NOT DETECTED Final   Haemophilus influenzae NOT DETECTED NOT DETECTED Final   Neisseria meningitidis NOT DETECTED NOT DETECTED Final   Pseudomonas aeruginosa NOT DETECTED NOT DETECTED Final   Candida albicans NOT DETECTED NOT DETECTED Final   Candida glabrata NOT DETECTED NOT DETECTED Final   Candida krusei NOT DETECTED NOT DETECTED Final    Candida parapsilosis NOT DETECTED NOT DETECTED Final   Candida tropicalis NOT DETECTED NOT DETECTED Final  MRSA PCR Screening     Status: None   Collection Time: 09/08/16  2:15 AM  Result Value Ref Range Status   MRSA by PCR NEGATIVE NEGATIVE Final    Comment:        The GeneXpert MRSA Assay (FDA approved for NASAL  specimens only), is one component of a comprehensive MRSA colonization surveillance program. It is not intended to diagnose MRSA infection nor to guide or monitor treatment for MRSA infections.      Coagulation Studies: No results for input(s): LABPROT, INR in the last 72 hours.  Urinalysis: No results for input(s): COLORURINE, LABSPEC, PHURINE, GLUCOSEU, HGBUR, BILIRUBINUR, KETONESUR, PROTEINUR, UROBILINOGEN, NITRITE, LEUKOCYTESUR in the last 72 hours.  Invalid input(s): APPERANCEUR      Imaging: No results found.   Assessment & Plan: Pt is a 83 y.o. yo male with  medical problems of BPH, Ch Sys CHF EF 15 %, CAD, HLD, HTN, who was admitted to [facility] on 09/07/2016 for evaluation of worsening SOB  1. ARF on CKD st 3 - likely secondary to cardiorenal syndrome 2. LE edema 3. Ch Systolic CHF   Recommendations: Overall patient has a guarded prognosis. In regards to his underlying chronic kidney disease and acute renal failure most recent creatinine was 2.51. We recommend renal function continued to be followed as an outpatient. The patient stated that he would not want renal replacement therapy should his kidney function continued to decline which is very reasonable. For now continue diuresis with Lasix 40 mg by mouth daily. Also recommend close cardiology follow-up for the patient.

## 2016-09-15 DIAGNOSIS — I25119 Atherosclerotic heart disease of native coronary artery with unspecified angina pectoris: Secondary | ICD-10-CM

## 2016-09-15 DIAGNOSIS — N184 Chronic kidney disease, stage 4 (severe): Secondary | ICD-10-CM | POA: Diagnosis not present

## 2016-09-15 DIAGNOSIS — I5022 Chronic systolic (congestive) heart failure: Secondary | ICD-10-CM | POA: Diagnosis not present

## 2016-09-15 DIAGNOSIS — K219 Gastro-esophageal reflux disease without esophagitis: Secondary | ICD-10-CM

## 2016-09-15 DIAGNOSIS — N400 Enlarged prostate without lower urinary tract symptoms: Secondary | ICD-10-CM

## 2016-10-10 DIAGNOSIS — K219 Gastro-esophageal reflux disease without esophagitis: Secondary | ICD-10-CM

## 2016-10-10 DIAGNOSIS — I5022 Chronic systolic (congestive) heart failure: Secondary | ICD-10-CM | POA: Diagnosis not present

## 2016-10-10 DIAGNOSIS — N184 Chronic kidney disease, stage 4 (severe): Secondary | ICD-10-CM | POA: Diagnosis not present

## 2016-10-10 DIAGNOSIS — I25119 Atherosclerotic heart disease of native coronary artery with unspecified angina pectoris: Secondary | ICD-10-CM | POA: Diagnosis not present

## 2016-10-17 DIAGNOSIS — I5020 Unspecified systolic (congestive) heart failure: Secondary | ICD-10-CM | POA: Diagnosis not present

## 2016-10-17 DIAGNOSIS — R0609 Other forms of dyspnea: Secondary | ICD-10-CM | POA: Diagnosis not present

## 2016-10-17 DIAGNOSIS — N184 Chronic kidney disease, stage 4 (severe): Secondary | ICD-10-CM | POA: Diagnosis not present

## 2016-11-04 DIAGNOSIS — E785 Hyperlipidemia, unspecified: Secondary | ICD-10-CM | POA: Diagnosis not present

## 2016-11-04 DIAGNOSIS — I25110 Atherosclerotic heart disease of native coronary artery with unstable angina pectoris: Secondary | ICD-10-CM | POA: Diagnosis not present

## 2016-11-04 DIAGNOSIS — I5020 Unspecified systolic (congestive) heart failure: Secondary | ICD-10-CM | POA: Diagnosis not present

## 2016-11-04 DIAGNOSIS — N183 Chronic kidney disease, stage 3 (moderate): Secondary | ICD-10-CM | POA: Diagnosis not present

## 2016-11-04 DIAGNOSIS — K219 Gastro-esophageal reflux disease without esophagitis: Secondary | ICD-10-CM | POA: Diagnosis not present

## 2016-11-04 DIAGNOSIS — I10 Essential (primary) hypertension: Secondary | ICD-10-CM | POA: Diagnosis not present

## 2016-11-04 DIAGNOSIS — N401 Enlarged prostate with lower urinary tract symptoms: Secondary | ICD-10-CM | POA: Diagnosis not present

## 2016-12-08 DIAGNOSIS — N184 Chronic kidney disease, stage 4 (severe): Secondary | ICD-10-CM | POA: Diagnosis not present

## 2016-12-08 DIAGNOSIS — I5023 Acute on chronic systolic (congestive) heart failure: Secondary | ICD-10-CM | POA: Diagnosis not present

## 2016-12-08 DIAGNOSIS — K219 Gastro-esophageal reflux disease without esophagitis: Secondary | ICD-10-CM | POA: Diagnosis not present

## 2016-12-08 DIAGNOSIS — I25119 Atherosclerotic heart disease of native coronary artery with unspecified angina pectoris: Secondary | ICD-10-CM | POA: Diagnosis not present

## 2016-12-15 DIAGNOSIS — F39 Unspecified mood [affective] disorder: Secondary | ICD-10-CM | POA: Diagnosis not present

## 2017-02-03 DEATH — deceased

## 2017-02-27 ENCOUNTER — Encounter (INDEPENDENT_AMBULATORY_CARE_PROVIDER_SITE_OTHER): Payer: Self-pay

## 2017-02-27 ENCOUNTER — Ambulatory Visit (INDEPENDENT_AMBULATORY_CARE_PROVIDER_SITE_OTHER): Payer: Self-pay | Admitting: Vascular Surgery

## 2017-02-27 ENCOUNTER — Other Ambulatory Visit (INDEPENDENT_AMBULATORY_CARE_PROVIDER_SITE_OTHER): Payer: Self-pay

## 2017-08-28 IMAGING — CR DG CHEST 2V
1 series · 2 of 2 positions shown · non-contrast
Comparison: Prior chest x-ray 07/03/2011

CLINICAL DATA: [AGE] male with shortness of breath, and mild
chest pain

EXAM:
CHEST  2 VIEW

[Series 1: dg chest 2 view · 0.14mm/px · 2 of 2 slices shown]
[im 1/2]
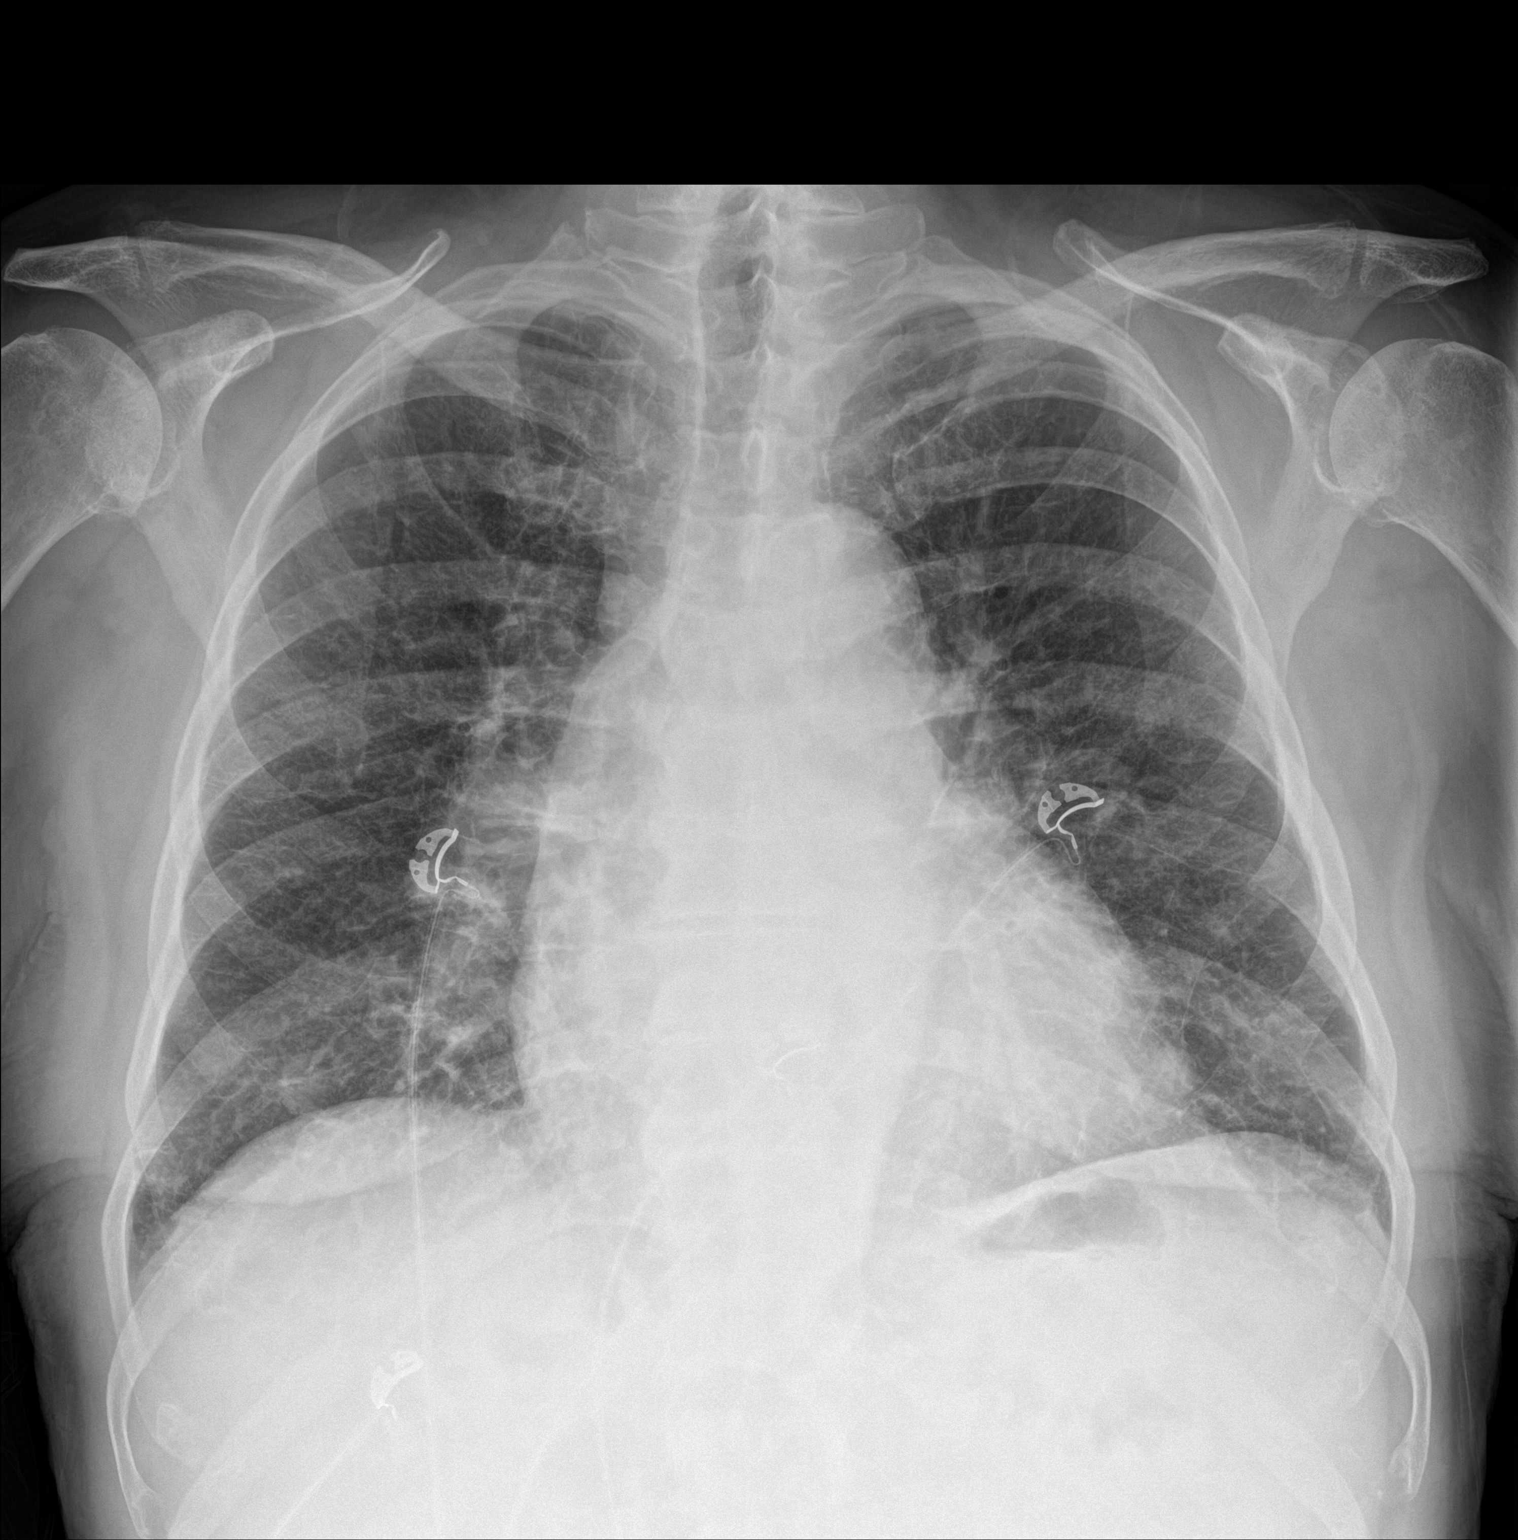
[im 2/2]
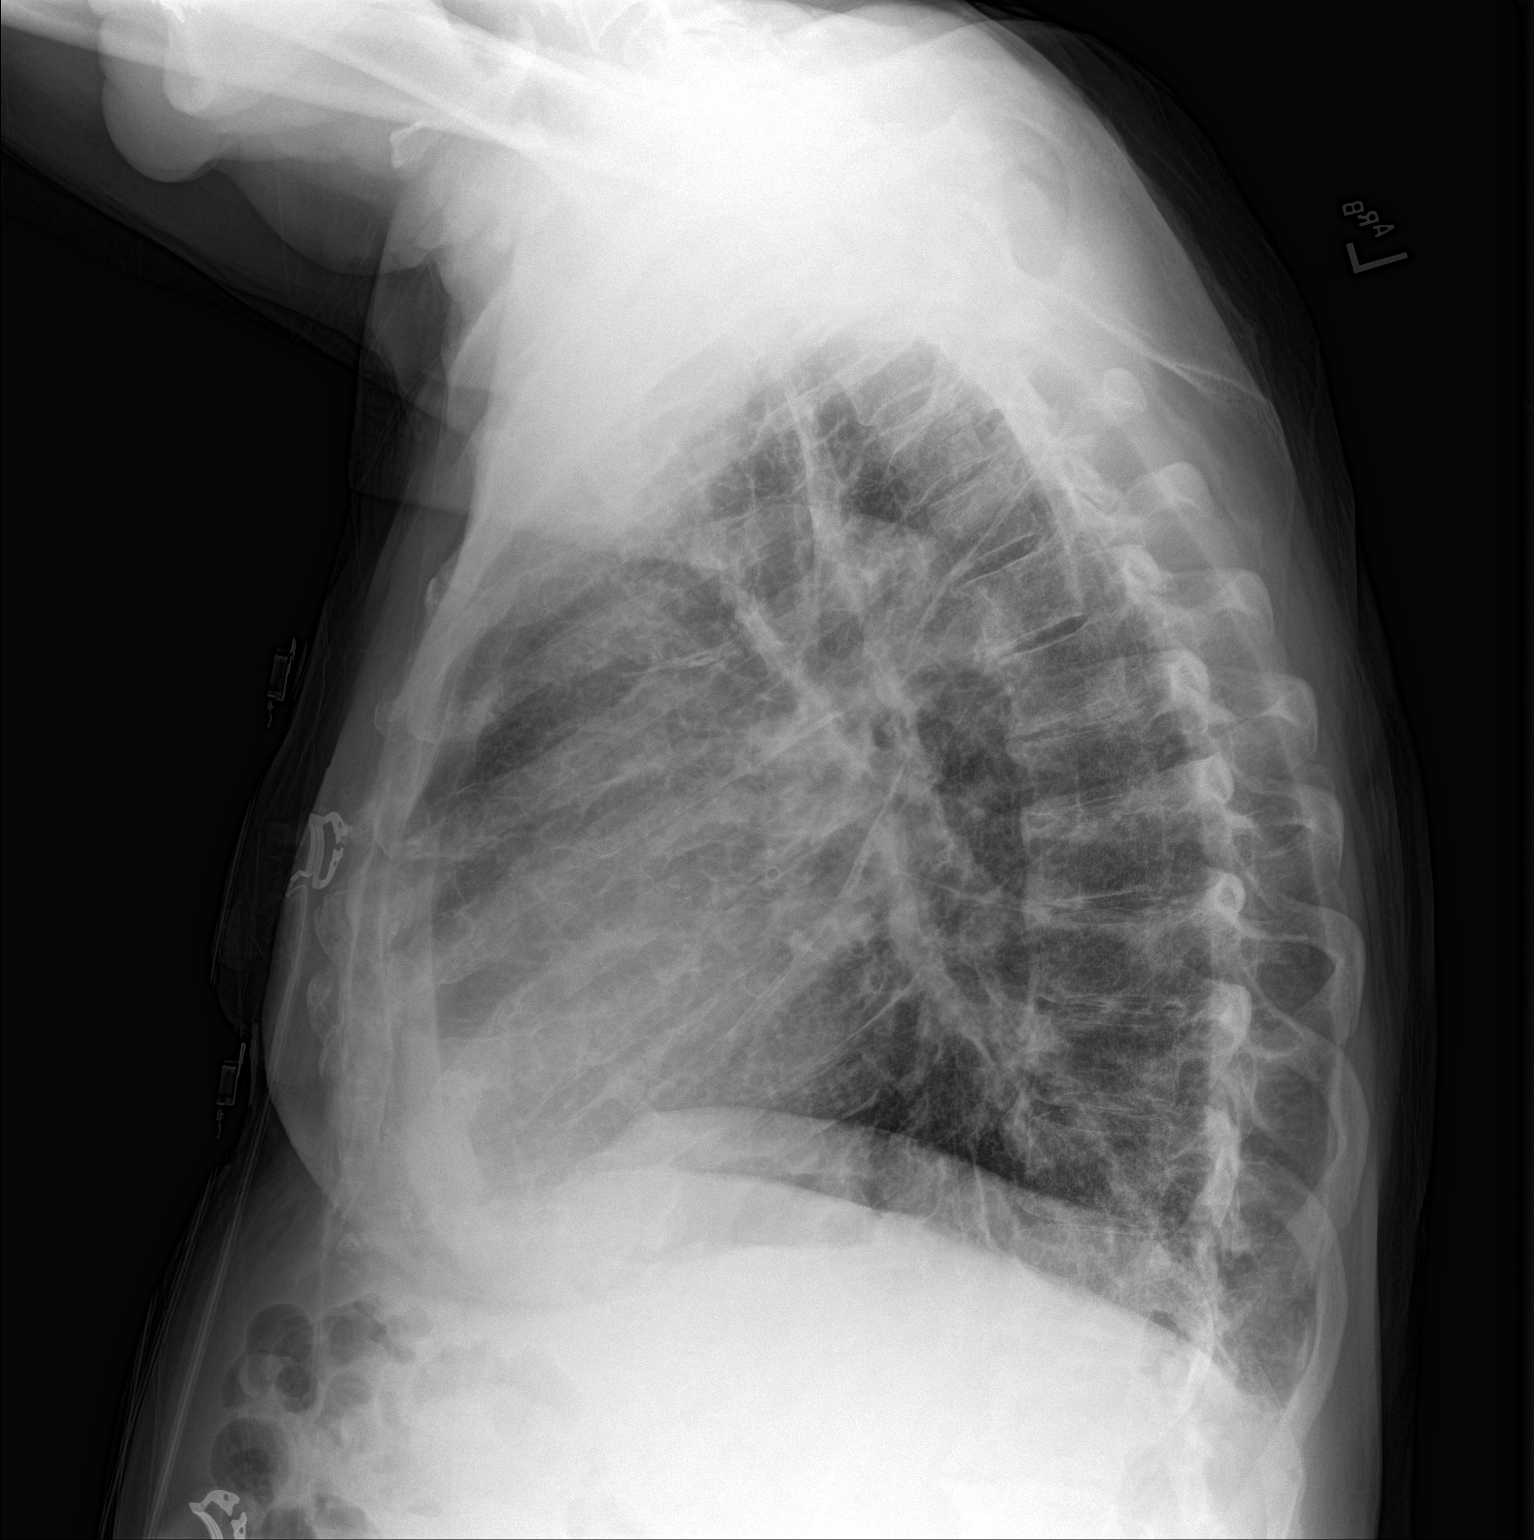

[2 of 2 positions shown; findings below may reference images not displayed]

FINDINGS: Mild cardiomegaly. Atherosclerotic calcifications in the transverse
aorta mediastinal contours are within normal limits. Diffuse mild
interstitial prominence bilaterally suggests mild interstitial
pulmonary edema. Kerley B-lines are present in the periphery of the
lungs. No pleural effusion, focal airspace consolidation or
pneumothorax. No suspicious pulmonary nodule or mass. Mild
bronchitic change. No acute osseous abnormality.
IMPRESSION: 1. Cardiomegaly with mild interstitial pulmonary edema most
consistent with mild CHF.
2. Aortic atherosclerosis.

## 2018-01-05 IMAGING — CR DG CHEST 2V
2 series · 2 of 2 positions shown · non-contrast
Comparison: 07/15/2015

CLINICAL DATA: Shortness of breath off and on since July 2015.
Hypertension.

EXAM:
CHEST  2 VIEW

[chest pa]
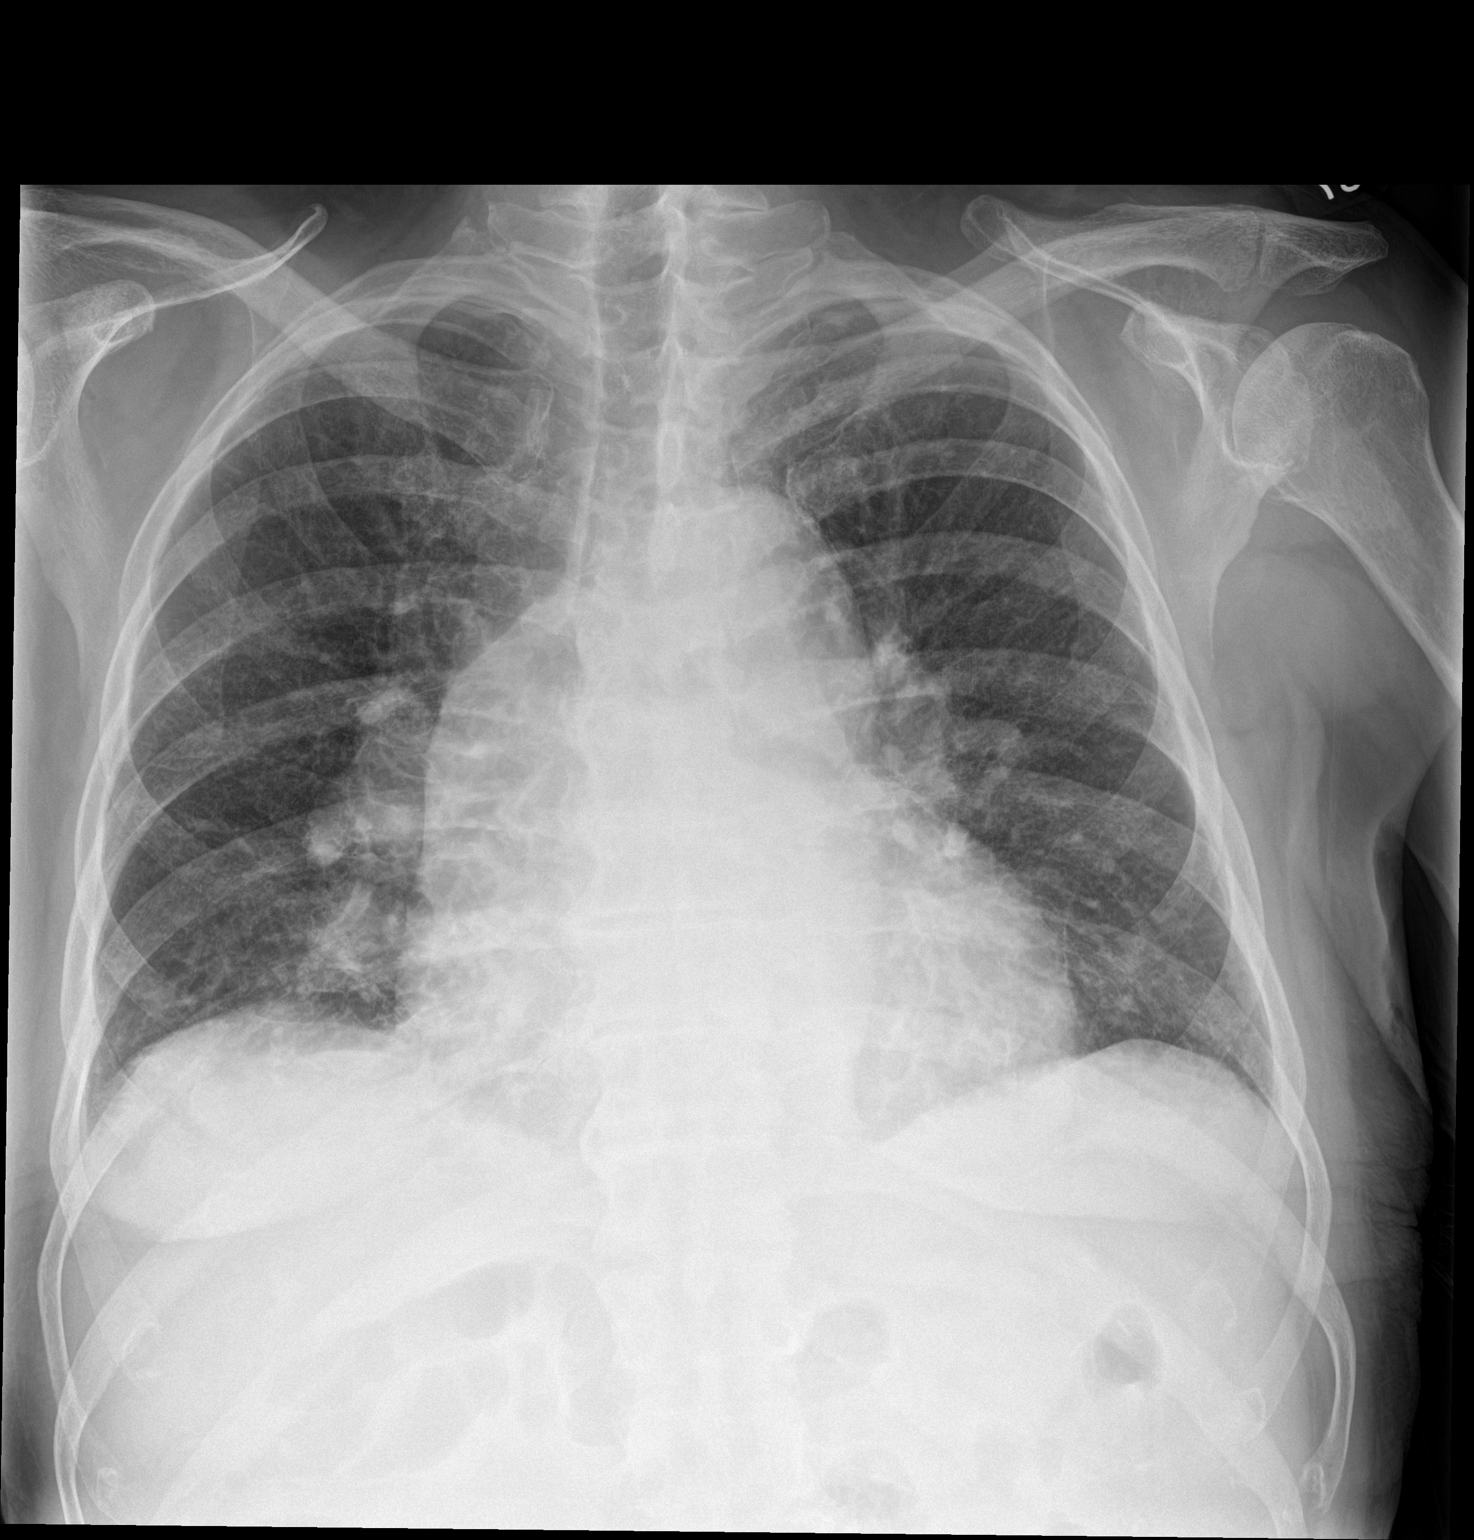

[chest lat]
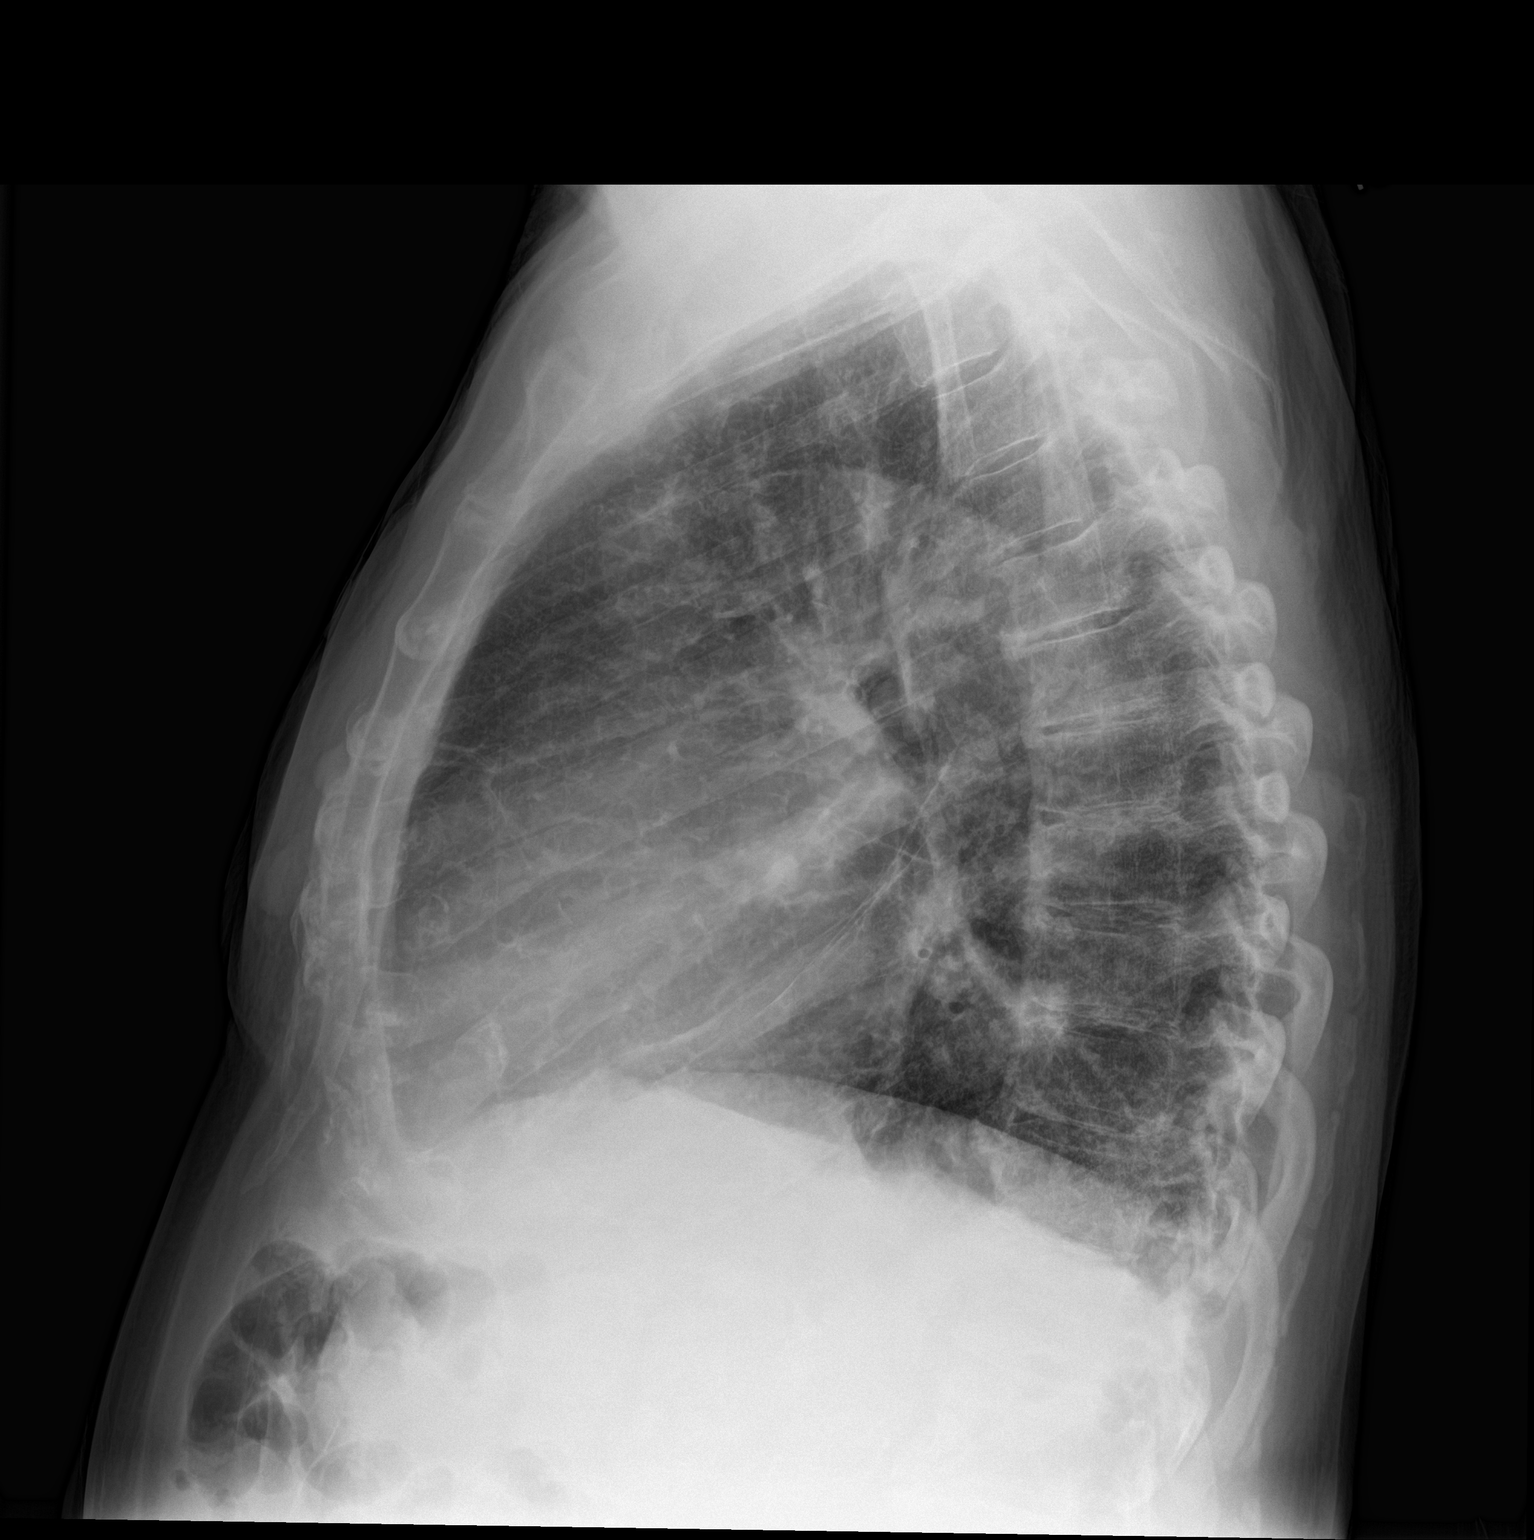

[2 of 2 positions shown; findings below may reference images not displayed]

FINDINGS: Mild cardiomegaly. Peribronchial thickening. No confluent airspace
opacities or effusions. No acute bony abnormality.
IMPRESSION: Cardiomegaly.

Bronchitic changes.

## 2018-10-29 IMAGING — CR DG CHEST 2V
1 series · 2 of 2 positions shown · non-contrast
Comparison: 11/02/2015

CLINICAL DATA: Shortness of breath for several months, history of
smoking

EXAM:
CHEST  2 VIEW

[Series 1: dg chest 2 view · 0.14mm/px · 2 of 2 slices shown]
[im 1/2]
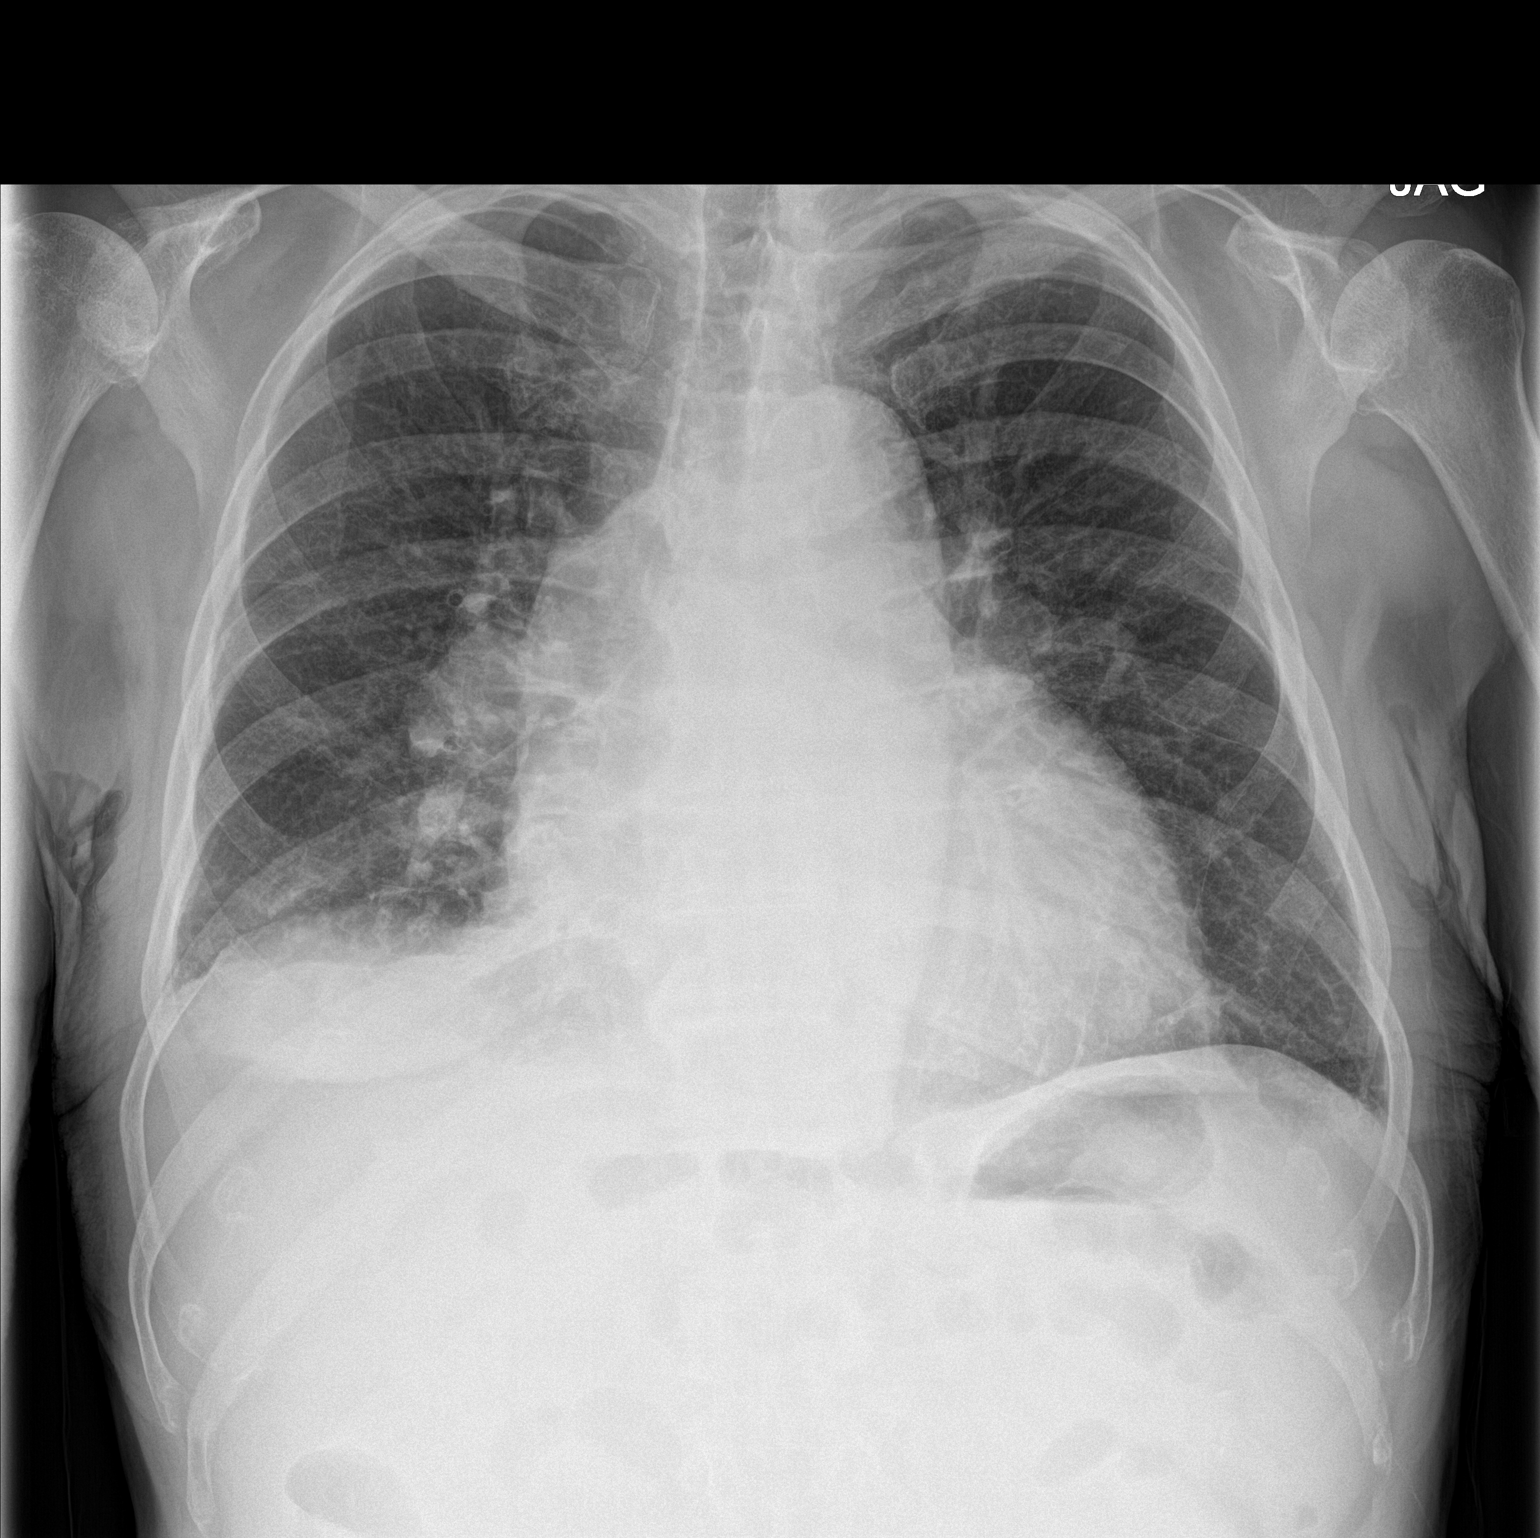
[im 2/2]
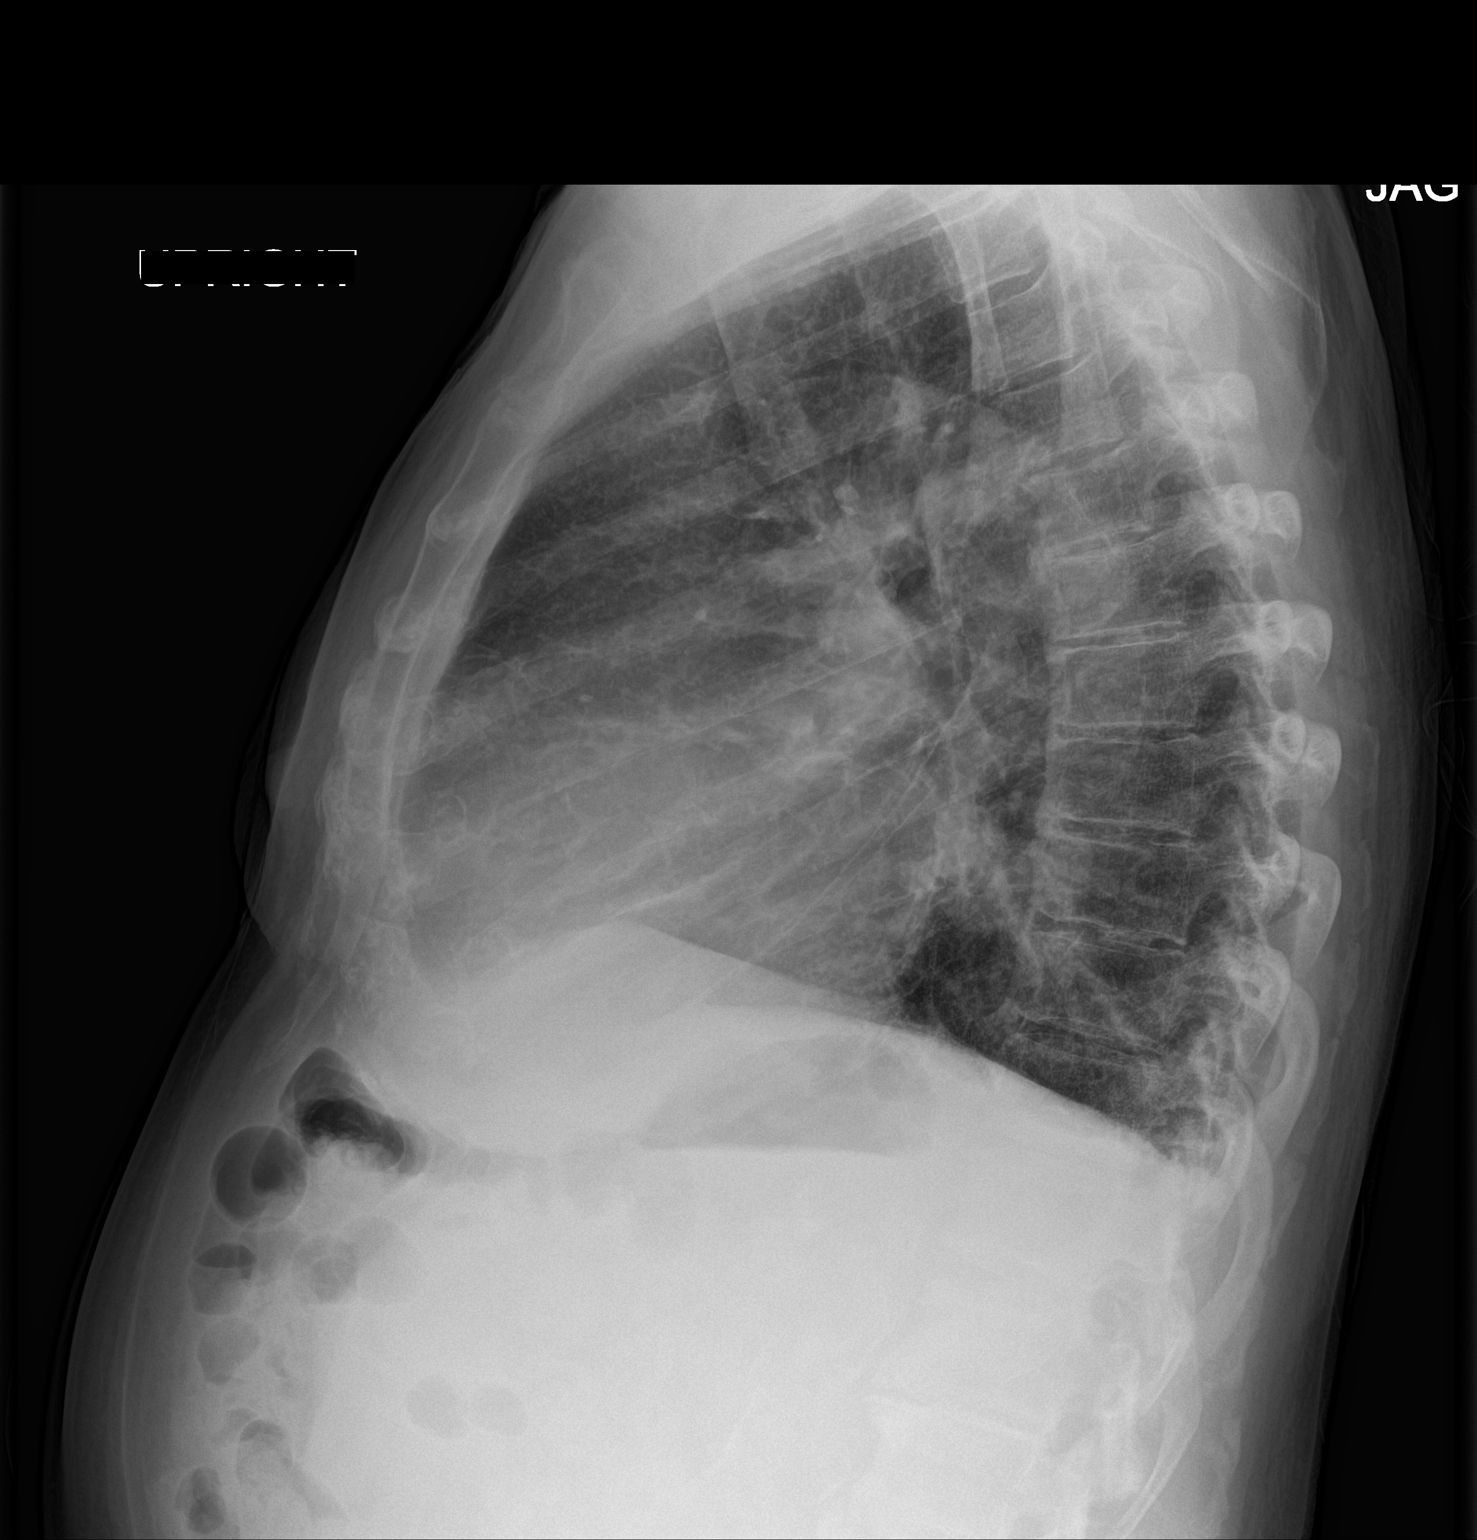

[2 of 2 positions shown; findings below may reference images not displayed]

FINDINGS: Heart is again enlarged in size. Tortuosity of the thoracic aorta is
again seen. The lungs are well aerated bilaterally. No focal
infiltrate or sizable effusion is seen. Mild chronic interstitial
changes are noted. No acute bony abnormality is seen. Nipple shadow
is noted over the left base better visualized than on the recent
exam.
IMPRESSION: No acute abnormality noted.

## 2018-12-12 IMAGING — CR DG CHEST 2V
2 series · 2 of 2 positions shown · non-contrast
Comparison: Two-view chest x-ray 07/25/2016

CLINICAL DATA: Shortness of breath for 1 year. Increased over the
last 1 week.

EXAM:
CHEST  2 VIEW

[chest pa]
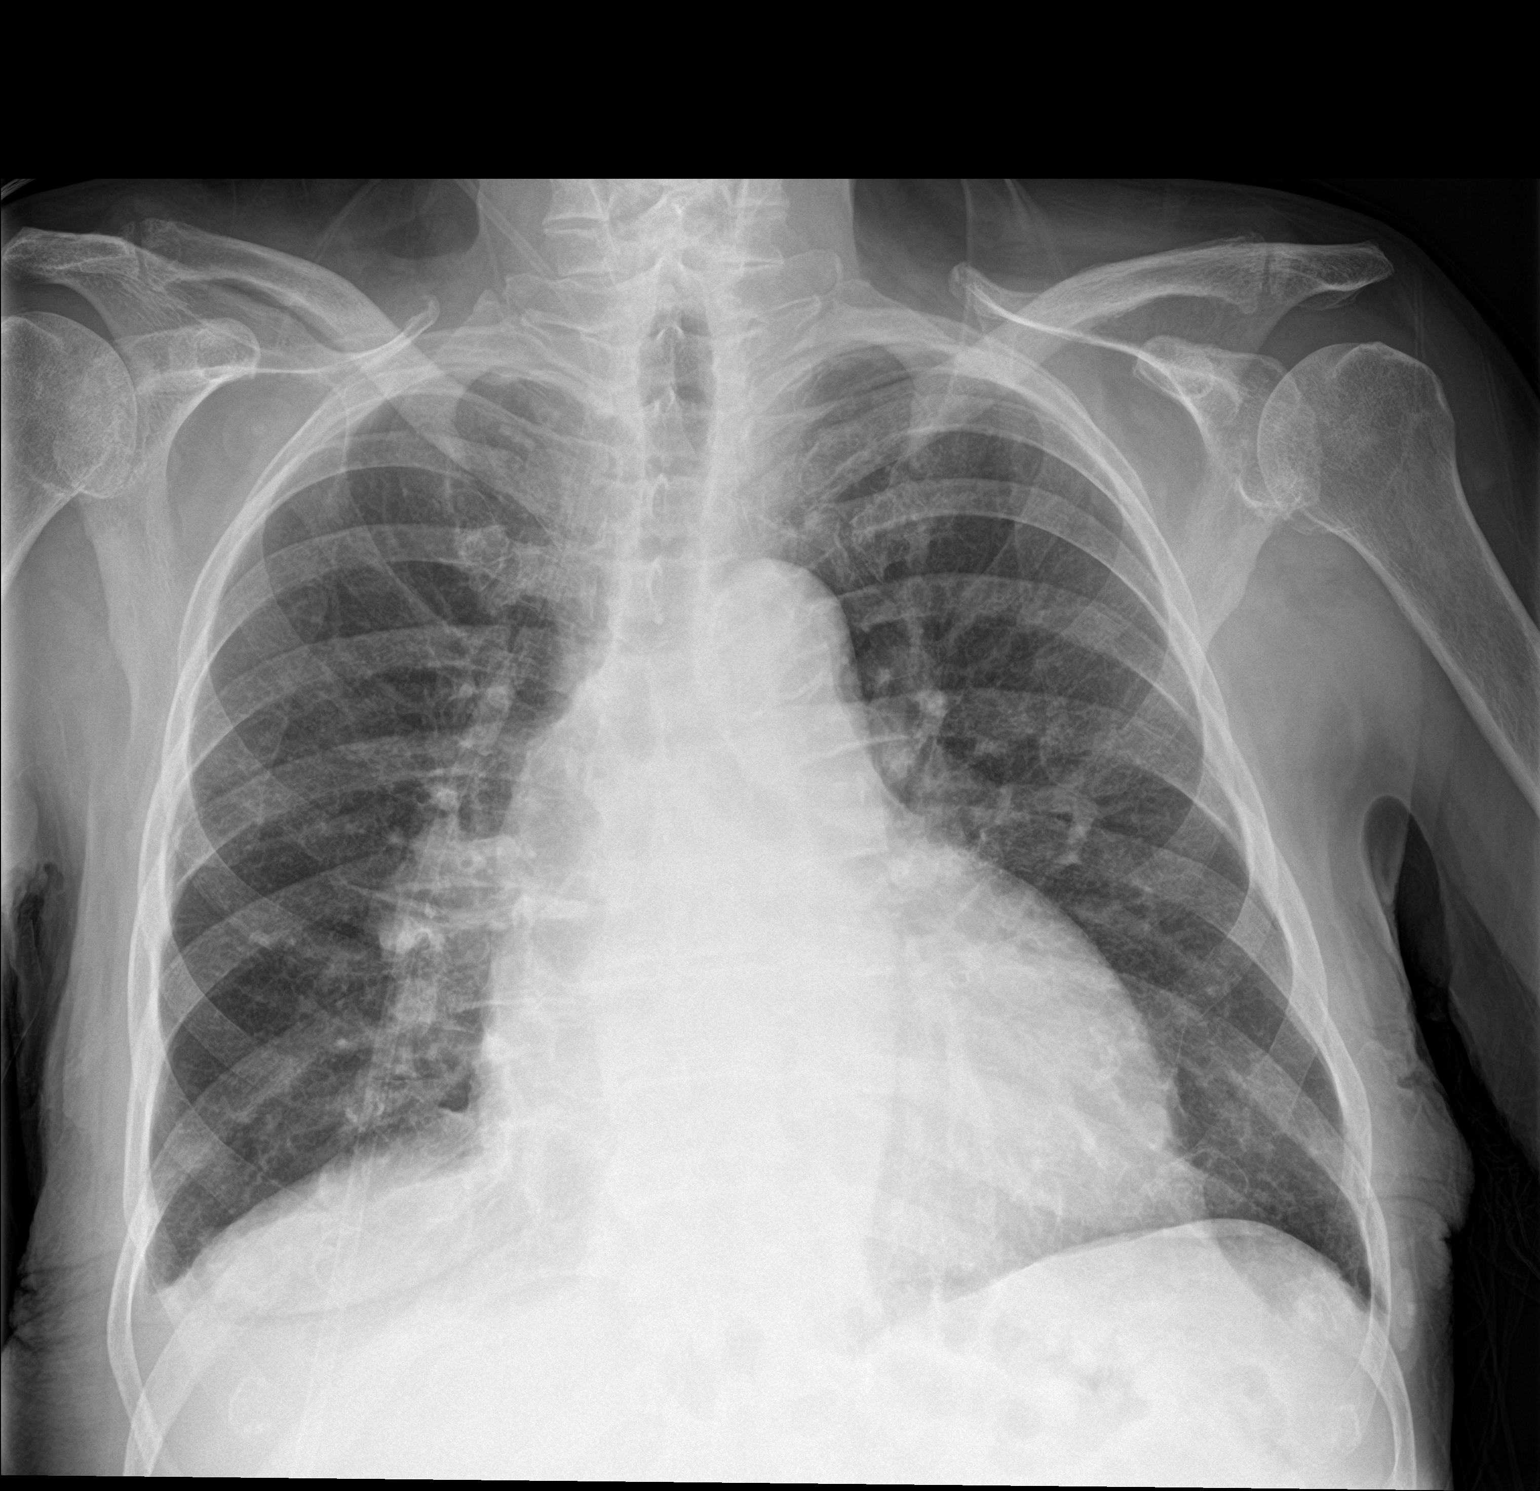

[chest lat]
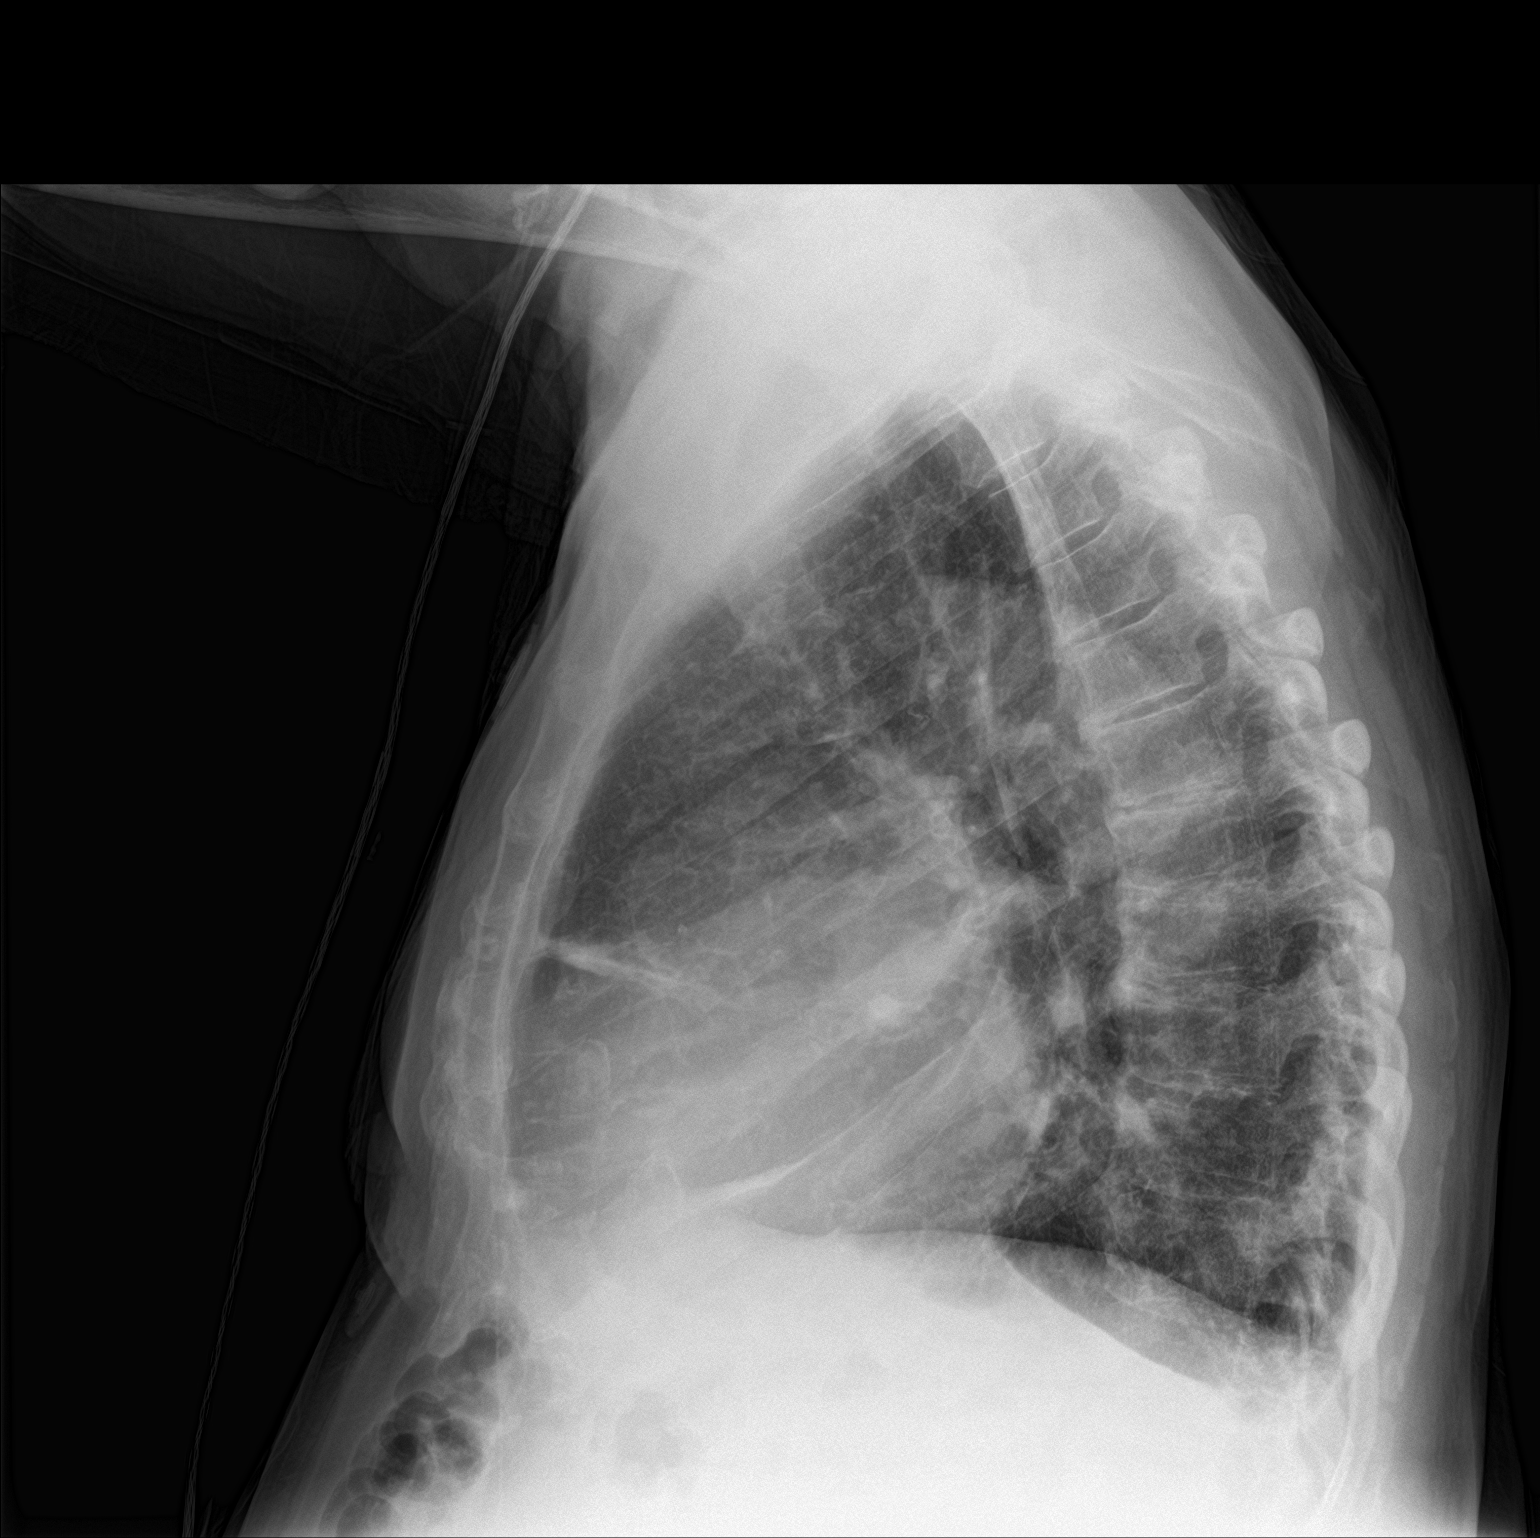

[2 of 2 positions shown; findings below may reference images not displayed]

FINDINGS: The heart is enlarged. Atherosclerotic calcifications of the aorta
are again noted. Previously noted right lower lobe airspace disease
has cleared. Ill-defined right middle lobe opacities are present.
The pulmonary arteries are prominent bilaterally. Degenerative
changes are noted in the thoracic spine.
IMPRESSION: 1. Ill-defined right middle lobe airspace disease reflecting
atelectasis or early infection.
2. The left lung is clear.
3. Cardiomegaly without failure.
4. Aortic atherosclerosis.
